# Patient Record
Sex: Male | Born: 1940
Health system: Southern US, Community
[De-identification: ages and names within clinical notes are randomized; demographics above are authoritative.]

## PROBLEM LIST (undated history)

## (undated) DIAGNOSIS — I519 Heart disease, unspecified: Secondary | ICD-10-CM

## (undated) DIAGNOSIS — E119 Type 2 diabetes mellitus without complications: Secondary | ICD-10-CM

## (undated) DIAGNOSIS — K635 Polyp of colon: Secondary | ICD-10-CM

## (undated) DIAGNOSIS — L821 Other seborrheic keratosis: Secondary | ICD-10-CM

## (undated) DIAGNOSIS — R011 Cardiac murmur, unspecified: Secondary | ICD-10-CM

## (undated) DIAGNOSIS — Z7952 Long term (current) use of systemic steroids: Secondary | ICD-10-CM

## (undated) DIAGNOSIS — F419 Anxiety disorder, unspecified: Secondary | ICD-10-CM

## (undated) DIAGNOSIS — I252 Old myocardial infarction: Secondary | ICD-10-CM

## (undated) DIAGNOSIS — Z951 Presence of aortocoronary bypass graft: Secondary | ICD-10-CM

## (undated) DIAGNOSIS — M353 Polymyalgia rheumatica: Secondary | ICD-10-CM

## (undated) DIAGNOSIS — N401 Enlarged prostate with lower urinary tract symptoms: Secondary | ICD-10-CM

## (undated) DIAGNOSIS — E785 Hyperlipidemia, unspecified: Secondary | ICD-10-CM

## (undated) DIAGNOSIS — H269 Unspecified cataract: Secondary | ICD-10-CM

## (undated) DIAGNOSIS — I639 Cerebral infarction, unspecified: Secondary | ICD-10-CM

## (undated) DIAGNOSIS — I25810 Atherosclerosis of coronary artery bypass graft(s) without angina pectoris: Secondary | ICD-10-CM

## (undated) DIAGNOSIS — M1990 Unspecified osteoarthritis, unspecified site: Secondary | ICD-10-CM

## (undated) DIAGNOSIS — I10 Essential (primary) hypertension: Secondary | ICD-10-CM

## (undated) HISTORY — DX: Cardiac murmur, unspecified: R01.1

## (undated) HISTORY — PX: EYE SURGERY: SHX253

## (undated) HISTORY — PX: WISDOM TOOTH EXTRACTION: SHX21

## (undated) HISTORY — PX: CORONARY ARTERY BYPASS GRAFT: SHX141

## (undated) HISTORY — DX: Unspecified cataract: H26.9

## (undated) HISTORY — DX: Essential (primary) hypertension: I10

## (undated) HISTORY — DX: Old myocardial infarction: I25.2

## (undated) HISTORY — DX: Unspecified osteoarthritis, unspecified site: M19.90

## (undated) HISTORY — DX: Hyperlipidemia, unspecified: E78.5

## (undated) HISTORY — DX: Type 2 diabetes mellitus without complications: E11.9

## (undated) HISTORY — DX: Cerebral infarction, unspecified: I63.9

## (undated) HISTORY — DX: Polyp of colon: K63.5

## (undated) HISTORY — PX: UPPER GASTROINTESTINAL ENDOSCOPY: SHX188

## (undated) HISTORY — DX: Heart disease, unspecified: I51.9

## (undated) HISTORY — DX: Anxiety disorder, unspecified: F41.9

---
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
## 1989-09-20 HISTORY — PX: OTHER SURGICAL HISTORY: SHX169

## 2013-08-20 HISTORY — PX: COLONOSCOPY: SHX174

## 2017-08-13 DIAGNOSIS — R319 Hematuria, unspecified: Secondary | ICD-10-CM | POA: Diagnosis not present

## 2017-08-13 DIAGNOSIS — E119 Type 2 diabetes mellitus without complications: Secondary | ICD-10-CM | POA: Diagnosis not present

## 2018-01-11 ENCOUNTER — Ambulatory Visit (INDEPENDENT_AMBULATORY_CARE_PROVIDER_SITE_OTHER): Payer: Medicare Other | Admitting: Family Medicine

## 2018-01-11 ENCOUNTER — Encounter: Payer: Self-pay | Admitting: Family Medicine

## 2018-01-11 ENCOUNTER — Encounter

## 2018-01-11 VITALS — BP 122/78 | HR 78 | Temp 98.0°F | Ht 67.0 in | Wt 194.1 lb

## 2018-01-11 DIAGNOSIS — M353 Polymyalgia rheumatica: Secondary | ICD-10-CM | POA: Diagnosis not present

## 2018-01-11 DIAGNOSIS — Z7952 Long term (current) use of systemic steroids: Secondary | ICD-10-CM

## 2018-01-11 DIAGNOSIS — E1165 Type 2 diabetes mellitus with hyperglycemia: Secondary | ICD-10-CM | POA: Insufficient documentation

## 2018-01-11 DIAGNOSIS — E119 Type 2 diabetes mellitus without complications: Secondary | ICD-10-CM

## 2018-01-11 HISTORY — DX: Type 2 diabetes mellitus without complications: E11.9

## 2018-01-11 HISTORY — DX: Long term (current) use of systemic steroids: Z79.52

## 2018-01-11 HISTORY — DX: Polymyalgia rheumatica: M35.3

## 2018-01-11 MED ORDER — PREDNISONE 10 MG PO TABS: ORAL_TABLET | ORAL | 0 refills | Status: DC

## 2018-01-11 MED ORDER — METFORMIN HCL 500 MG PO TABS: 1000.0000 mg | ORAL_TABLET | Freq: Two times a day (BID) | ORAL | 3 refills | Status: DC

## 2018-01-11 MED ORDER — ALPRAZOLAM 0.25 MG PO TABS: 0.2500 mg | ORAL_TABLET | Freq: Two times a day (BID) | ORAL | 5 refills | Status: DC | PRN

## 2018-01-11 MED ORDER — NITROGLYCERIN 0.4 MG SL SUBL: 0.4000 mg | SUBLINGUAL_TABLET | Freq: Every day | SUBLINGUAL | 0 refills | Status: DC | PRN

## 2018-01-11 MED ORDER — ATORVASTATIN CALCIUM 40 MG PO TABS: 40.0000 mg | ORAL_TABLET | Freq: Every day | ORAL | 3 refills | Status: DC

## 2018-01-11 MED ORDER — AMLODIPINE BESYLATE 5 MG PO TABS: 5.0000 mg | ORAL_TABLET | Freq: Every day | ORAL | 3 refills | Status: DC

## 2018-01-11 MED ORDER — GLYBURIDE 5 MG PO TABS: 5.0000 mg | ORAL_TABLET | Freq: Every day | ORAL | 3 refills | Status: DC

## 2018-01-11 MED ORDER — CARVEDILOL 3.125 MG PO TABS: 3.1250 mg | ORAL_TABLET | Freq: Two times a day (BID) | ORAL | 3 refills | Status: DC

## 2018-01-11 MED ORDER — LISINOPRIL-HYDROCHLOROTHIAZIDE 20-12.5 MG PO TABS: 1.0000 | ORAL_TABLET | Freq: Every day | ORAL | 3 refills | Status: DC

## 2018-01-11 NOTE — Patient Instructions (Addendum)
If you do not hear anything about your referrals and imaging in the next 1-2 weeks, call our office and ask for an update.  If the eye dr is too expensive, OK to get DM exam at [facility].  Let us know if you need anything.

## 2018-01-11 NOTE — Progress Notes (Signed)
Pre visit review using our clinic review tool, if applicable. No additional management support is needed unless otherwise documented below in the visit note. 

## 2018-01-11 NOTE — Progress Notes (Signed)
Chief Complaint  Patient presents with  . Establish Care       New Patient Visit SUBJECTIVE: HPI: Gary Flynn is an 77 y.o.male who is being seen for establishing care.   The patient was previously seen at office in [facility].  The patient has a history of polymyalgia rheumatica.  He is on 1-4 mg prednisone daily for this.  This is been the case for the past 2 years.  He has never had a bone density scan and has never seen a rheumatologist.  2 weeks ago, he started having a flare again.  No injury or change in activity.  He has a history of type 2 diabetes.  His last A1c was 7.0.  He is on prednisone at a near daily basis.  He has a history of a heart attack with triple bypass in 1991.  He used to be on Lipitor but was taken off, he cannot remember why.  He has not had an eye exam in over a year.  He is up-to-date with his flu shot and has had both pneumonia shots since turning 49. He is on ACEi.     Allergies  Allergen Reactions  . Ciprofloxacin   . Penicillins   . Tetanus Toxoids     Past Medical History:  Diagnosis Date  . Arthritis   . Colon polyps   . Diabetes mellitus without complication ([place])   . Heart disease   . Heart murmur   . Hyperlipidemia   . Hypertension   . Stroke [facility])    Past Surgical History:  Procedure Laterality Date  . HERNIA REPAIR  1960  . triple bypass  1991    Family History  Problem Relation Age of Onset  . Cancer Mother   . Heart disease Father   . Heart attack Father   . Cancer Sister   . Diabetes Sister   . Alcohol abuse Sister   . COPD Sister   . Early death Sister      Current Outpatient Medications:  .  ALPRAZolam (XANAX) 0.25 MG tablet, Take 1 tablet (0.25 mg total) by mouth 2 (two) times daily as needed for anxiety., Disp: 30 tablet, Rfl: 5 .  amLODipine (NORVASC) 5 MG tablet, Take 1 tablet (5 mg total) by mouth daily., Disp: 90 tablet, Rfl: 3 .  aspirin EC 81 MG tablet, Take 81 mg by mouth daily., Disp: , Rfl:  .   carvedilol (COREG) 3.125 MG tablet, Take 1 tablet (3.125 mg total) by mouth 2 (two) times daily with a meal., Disp: 180 tablet, Rfl: 3 .  glyBURIDE (DIABETA) 5 MG tablet, Take 1 tablet (5 mg total) by mouth daily with breakfast., Disp: 90 tablet, Rfl: 3 .  lisinopril-hydrochlorothiazide (PRINZIDE,ZESTORETIC) 20-12.5 MG tablet, Take 1 tablet by mouth daily., Disp: 90 tablet, Rfl: 3 .  metFORMIN (GLUCOPHAGE) 500 MG tablet, Take 2 tablets (1,000 mg total) by mouth 2 (two) times daily with a meal. Take 2 tablets twice daily, Disp: 360 tablet, Rfl: 3 .  nitroGLYCERIN (NITROSTAT) 0.4 MG SL tablet, Place 1 tablet (0.4 mg total) under the tongue daily as needed for chest pain., Disp: 30 tablet, Rfl: 0 .  predniSONE (DELTASONE) 1 MG tablet, Take 1 to 4 tablets daily as needed, Disp: , Rfl:  .  atorvastatin (LIPITOR) 40 MG tablet, Take 1 tablet (40 mg total) by mouth daily., Disp: 90 tablet, Rfl: 3 .  predniSONE (DELTASONE) 10 MG tablet, Take 2 tabs daily for 7 days and then  1 tab daily for 7 days., Disp: 21 tablet, Rfl: 0  ROS  Cardiovascular: Denies chest pain  Respiratory: Denies dyspnea   OBJECTIVE: BP 122/78 (BP Location: Left Arm, Patient Position: Sitting, Cuff Size: Normal)   Pulse 78   Temp 98 F (36.7 C) (Oral)   Ht 5\' 7"  (1.702 m)   Wt 194 lb 2 oz (88.1 kg)   SpO2 96%   BMI 30.40 kg/m   Constitutional: -  VS reviewed -  Well developed, well nourished, appears stated age -  No apparent distress  Psychiatric: -  Oriented to person, place, and time -  Memory intact -  Affect and mood normal -  Fluent conversation, good eye contact -  Judgment and insight age appropriate  Eye: -  Conjunctivae clear, no discharge -  Pupils symmetric, round, reactive to light  ENMT: -  MMM    Pharynx moist, no exudate, no erythema  Neck: -  No gross swelling, no palpable masses -  Thyroid midline, not enlarged, mobile, no palpable masses  Cardiovascular: -  RRR -  No LE edema  Respiratory: -   Normal respiratory effort, no accessory muscle use, no retraction -  Breath sounds equal, no wheezes, no ronchi, no crackles  Musculoskeletal: -  No clubbing, no cyanosis -  Gait normal  Skin: -  No significant lesion on inspection -  Warm and dry to palpation   ASSESSMENT/PLAN: Polymyalgia rheumatica ([place]) - Plan: Ambulatory referral to Rheumatology  Controlled type 2 diabetes mellitus without complication, without long-term current use of insulin ([place]) - Plan: Ambulatory referral to Ophthalmology  Long term (current) use of systemic steroids - Plan: DG Bone Density  Pt will get [place] hard copy of records. Will give a short taper of high-dose prednisone to do with his flare.  I would like him to see rheumatology for this issue. Continue current regimen for diabetes.  Recommended starting a statin given his history of coronary artery disease, MI, and diabetes.  Will refer to ophthalmology as he is due.  Will await records to make sure he is up-to-date with his immunizations. Given his long-term use of steroids, we will set up a bone density scan. Patient should return  In 3 mo for dm visit. The patient voiced understanding and agreement to the plan.   [place], DO 01/11/18  12:20 PM

## 2018-01-12 ENCOUNTER — Other Ambulatory Visit: Payer: Self-pay | Admitting: Emergency Medicine

## 2018-01-12 ENCOUNTER — Telehealth: Payer: Self-pay | Admitting: Family Medicine

## 2018-01-12 NOTE — Telephone Encounter (Signed)
Some records reviewed. I did not see a tetanus booster in hx. Last A1c 7.0. I think this is an OK level given his age. Other labs unremarkable, nothing immediate needs to be done.

## 2018-01-16 ENCOUNTER — Ambulatory Visit (HOSPITAL_BASED_OUTPATIENT_CLINIC_OR_DEPARTMENT_OTHER)
Admission: RE | Admit: 2018-01-16 | Discharge: 2018-01-16 | Disposition: A | Discharge status: Home / Self Care | Payer: Medicare Other | Source: Ambulatory Visit | Attending: Family Medicine | Admitting: Family Medicine

## 2018-01-16 DIAGNOSIS — M85851 Other specified disorders of bone density and structure, right thigh: Secondary | ICD-10-CM | POA: Insufficient documentation

## 2018-01-16 DIAGNOSIS — Z7952 Long term (current) use of systemic steroids: Secondary | ICD-10-CM | POA: Diagnosis not present

## 2018-01-18 DIAGNOSIS — H353131 Nonexudative age-related macular degeneration, bilateral, early dry stage: Secondary | ICD-10-CM | POA: Diagnosis not present

## 2018-01-18 DIAGNOSIS — H35372 Puckering of macula, left eye: Secondary | ICD-10-CM | POA: Diagnosis not present

## 2018-01-18 DIAGNOSIS — Z961 Presence of intraocular lens: Secondary | ICD-10-CM | POA: Diagnosis not present

## 2018-01-18 DIAGNOSIS — H26493 Other secondary cataract, bilateral: Secondary | ICD-10-CM | POA: Diagnosis not present

## 2018-01-20 ENCOUNTER — Encounter: Payer: Self-pay | Admitting: Family Medicine

## 2018-01-23 ENCOUNTER — Other Ambulatory Visit: Payer: Self-pay | Admitting: Family Medicine

## 2018-01-23 DIAGNOSIS — Z1211 Encounter for screening for malignant neoplasm of colon: Secondary | ICD-10-CM

## 2018-01-25 ENCOUNTER — Other Ambulatory Visit: Payer: Self-pay | Admitting: Family Medicine

## 2018-01-25 ENCOUNTER — Telehealth: Payer: Self-pay | Admitting: Internal Medicine

## 2018-01-25 NOTE — Telephone Encounter (Signed)
Pt requesting refill on prednisone?

## 2018-01-26 MED ORDER — PREDNISONE 10 MG PO TABS: 5.0000 mg | ORAL_TABLET | Freq: Every day | ORAL | 2 refills | Status: DC

## 2018-01-26 NOTE — Telephone Encounter (Signed)
Pt w suspected PMR, in process of setting up with rheum. Will call in supply until he can get in.

## 2018-01-27 ENCOUNTER — Encounter: Payer: Self-pay | Admitting: Internal Medicine

## 2018-01-27 NOTE — Telephone Encounter (Signed)
Dr.Perry reviewed records and accepted for patient to be scheduled for a direct colonoscopy in [place]. Patient has been notified of this and ha been scheduled.

## 2018-01-27 NOTE — Telephone Encounter (Signed)
Relevant records reviewed. Last colonoscopy 09/05/2013 in [place]. 5 mm polyp removed with cold biopsy forceps. Pathology reveals tubular adenoma. Would be due for routine follow-up December 2019.

## 2018-01-30 ENCOUNTER — Encounter: Payer: Self-pay | Admitting: Family Medicine

## 2018-01-31 ENCOUNTER — Ambulatory Visit (INDEPENDENT_AMBULATORY_CARE_PROVIDER_SITE_OTHER): Payer: Medicare Other | Admitting: Family Medicine

## 2018-01-31 ENCOUNTER — Encounter: Payer: Self-pay | Admitting: Family Medicine

## 2018-01-31 ENCOUNTER — Ambulatory Visit: Payer: Medicare Other | Admitting: Family Medicine

## 2018-01-31 VITALS — BP 112/70 | HR 88 | Temp 98.0°F | Ht 67.0 in | Wt 192.2 lb

## 2018-01-31 DIAGNOSIS — J209 Acute bronchitis, unspecified: Secondary | ICD-10-CM | POA: Diagnosis not present

## 2018-01-31 MED ORDER — PREDNISONE 20 MG PO TABS: 40.0000 mg | ORAL_TABLET | Freq: Every day | ORAL | 0 refills | Status: AC

## 2018-01-31 MED ORDER — AZITHROMYCIN 250 MG PO TABS: ORAL_TABLET | ORAL | 0 refills | Status: DC

## 2018-01-31 MED ORDER — HYDROCODONE-HOMATROPINE 5-1.5 MG/5ML PO SYRP: 5.0000 mL | ORAL_SOLUTION | Freq: Four times a day (QID) | ORAL | 0 refills | Status: DC | PRN

## 2018-01-31 NOTE — Progress Notes (Signed)
Pre visit review using our clinic review tool, if applicable. No additional management support is needed unless otherwise documented below in the visit note. 

## 2018-01-31 NOTE — Progress Notes (Signed)
Chief Complaint  Patient presents with  . Cough    for one month    Gary Flynn here for URI complaints.  Duration: 3 weeks  Associated symptoms: rhinorrhea, wheezing and cough Denies: sinus congestion, sinus pain, itchy watery eyes, ear pain, ear drainage, sore throat, shortness of breath, myalgia and fevers Treatment to date: Hycodan Sick contacts: No  ROS:  Const: Denies fevers HEENT: As noted in HPI Lungs: No SOB  Past Medical History:  Diagnosis Date  . Arthritis   . Colon polyps   . Diabetes mellitus without complication ([place])   . Heart disease   . Heart murmur   . Hyperlipidemia   . Hypertension   . Stroke ([place])     BP 112/70 (BP Location: Left Arm, Patient Position: Sitting, Cuff Size: Normal)   Pulse 88   Temp 98 F (36.7 C) (Oral)   Ht 5\' 7"  (1.702 m)   Wt 192 lb 4 oz (87.2 kg)   SpO2 93%   BMI 30.11 kg/m  General: Awake, alert, appears stated age HEENT: AT, [place], ears patent b/l and TM's neg, nares patent w/o discharge, pharynx pink and without exudates, MMM Neck: No masses or asymmetry Heart: RRR Lungs: Diff exp wheezing, no accessory muscle use Psych: Age appropriate judgment and insight, normal mood and affect  Acute wheezy bronchitis - Plan: azithromycin (ZITHROMAX) 250 MG tablet, predniSONE (DELTASONE) 20 MG tablet, HYDROcodone-homatropine (HYCODAN) 5-1.5 MG/5ML syrup  Orders as above. Syrup- warning for this given. Prednisone- replace reg pred with a burst. Abx- only if not getting better over next 2-3 days. Continue to push fluids, practice good hand hygiene, cover mouth when coughing. F/u prn. If starting to experience fevers, shaking, or shortness of breath, seek immediate care. Pt voiced understanding and agreement to the plan.  [place], DO 01/31/18 2:58 PM

## 2018-01-31 NOTE — Patient Instructions (Addendum)
Do not drink alcohol, do any illicit/street drugs, drive or do anything that requires alertness while on this cough syrup.   Continue to push fluids, practice good hand hygiene, and cover your mouth if you cough.  If you start having fevers, shaking or shortness of breath, seek immediate care.  Don't take your usual supply of prednisone while on the higher dose that I am calling in.   Don't take antibiotic (azithromycin) for next 2-3 days and only if you are not getting better.  Let us know if you need anything.

## 2018-02-17 DIAGNOSIS — I252 Old myocardial infarction: Secondary | ICD-10-CM | POA: Insufficient documentation

## 2018-02-17 DIAGNOSIS — M353 Polymyalgia rheumatica: Secondary | ICD-10-CM | POA: Diagnosis not present

## 2018-02-17 DIAGNOSIS — M8580 Other specified disorders of bone density and structure, unspecified site: Secondary | ICD-10-CM | POA: Diagnosis not present

## 2018-02-17 DIAGNOSIS — I25810 Atherosclerosis of coronary artery bypass graft(s) without angina pectoris: Secondary | ICD-10-CM | POA: Insufficient documentation

## 2018-02-17 DIAGNOSIS — Z951 Presence of aortocoronary bypass graft: Secondary | ICD-10-CM | POA: Insufficient documentation

## 2018-02-17 HISTORY — DX: Atherosclerosis of coronary artery bypass graft(s) without angina pectoris: I25.810

## 2018-02-17 HISTORY — DX: Presence of aortocoronary bypass graft: Z95.1

## 2018-03-06 ENCOUNTER — Encounter: Payer: Self-pay | Admitting: Family Medicine

## 2018-03-08 ENCOUNTER — Ambulatory Visit (INDEPENDENT_AMBULATORY_CARE_PROVIDER_SITE_OTHER): Payer: Medicare Other | Admitting: Family Medicine

## 2018-03-08 ENCOUNTER — Encounter: Payer: Self-pay | Admitting: Family Medicine

## 2018-03-08 VITALS — BP 112/72 | HR 72 | Temp 97.8°F | Ht 67.0 in | Wt 194.1 lb

## 2018-03-08 DIAGNOSIS — R350 Frequency of micturition: Secondary | ICD-10-CM

## 2018-03-08 DIAGNOSIS — N401 Enlarged prostate with lower urinary tract symptoms: Secondary | ICD-10-CM | POA: Diagnosis not present

## 2018-03-08 DIAGNOSIS — L821 Other seborrheic keratosis: Secondary | ICD-10-CM | POA: Diagnosis not present

## 2018-03-08 DIAGNOSIS — M85649 Other cyst of bone, unspecified hand: Secondary | ICD-10-CM | POA: Diagnosis not present

## 2018-03-08 HISTORY — DX: Other seborrheic keratosis: L82.1

## 2018-03-08 HISTORY — DX: Benign prostatic hyperplasia with lower urinary tract symptoms: N40.1

## 2018-03-08 NOTE — Patient Instructions (Signed)
The lesions on your head are normal. If they start bothering you, let me know and we can remove them.  If you decide you want to try a medicine for your urinary issue, let me know.  Let us know if you need anything.

## 2018-03-08 NOTE — Progress Notes (Signed)
Pre visit review using our clinic review tool, if applicable. No additional management support is needed unless otherwise documented below in the visit note. 

## 2018-03-08 NOTE — Progress Notes (Signed)
Chief Complaint  Patient presents with  . Urinary Frequency  . scalp and hands growths/lumps    Gary Flynn is a 77 y.o. male here for a skin complaint.  Duration: 3 months Location: scalp Pruritic? No Painful? No Drainage? No New soaps/lotions/topicals/detergents? No Seems to be growing. No sig hx of sunburns on scalp.    He also states that his wife told him that he urinates more frequently as he is been getting older.  He did receive routine prostate cancer screening while in [place] before moving down here.  There were never any issues.  He is not having any blood in his urine, pain with urination, fevers, abdominal pain, or constipation.  ROS:  Const: No fevers Skin: As noted in HPI  Past Medical History:  Diagnosis Date  . Arthritis   . Colon polyps   . Diabetes mellitus without complication ([place])   . Heart disease   . Heart murmur   . Hyperlipidemia   . Hypertension   . Stroke [facility])    Allergies  Allergen Reactions  . Ciprofloxacin   . Penicillins   . Tetanus Toxoids    Allergies as of 03/08/2018      Reactions   Ciprofloxacin    Penicillins    Tetanus Toxoids       Medication List        Accurate as of 03/08/18  1:00 PM. Always use your most recent med list.          alendronate 70 MG tablet Commonly known as:  FOSAMAX Take 70 mg by mouth once a week. Take with a full glass of water on an empty stomach.   ALPRAZolam 0.25 MG tablet Commonly known as:  XANAX Take 1 tablet (0.25 mg total) by mouth 2 (two) times daily as needed for anxiety.   amLODipine 5 MG tablet Commonly known as:  NORVASC Take 1 tablet (5 mg total) by mouth daily.   aspirin EC 81 MG tablet Take 81 mg by mouth daily.   atorvastatin 40 MG tablet Commonly known as:  LIPITOR Take 1 tablet (40 mg total) by mouth daily.   carvedilol 3.125 MG tablet Commonly known as:  COREG Take 1 tablet (3.125 mg total) by mouth 2 (two) times daily with a meal.   glyBURIDE 5 MG  tablet Commonly known as:  DIABETA Take 1 tablet (5 mg total) by mouth daily with breakfast.   lisinopril-hydrochlorothiazide 20-12.5 MG tablet Commonly known as:  PRINZIDE,ZESTORETIC Take 1 tablet by mouth daily.   metFORMIN 500 MG tablet Commonly known as:  GLUCOPHAGE Take 2 tablets (1,000 mg total) by mouth 2 (two) times daily with a meal. Take 2 tablets twice daily   nitroGLYCERIN 0.4 MG SL tablet Commonly known as:  NITROSTAT Place 1 tablet (0.4 mg total) under the tongue daily as needed for chest pain.   predniSONE 1 MG tablet Commonly known as:  DELTASONE Take 1 to 4 tablets daily as needed   predniSONE 10 MG tablet Commonly known as:  DELTASONE Take 0.5-1 tablets (5-10 mg total) by mouth daily with breakfast.       BP 112/72 (BP Location: Left Arm, Patient Position: Sitting, Cuff Size: Large)   Pulse 72   Temp 97.8 F (36.6 C) (Oral)   Ht 5\' 7"  (1.702 m)   Wt 194 lb 2 oz (88.1 kg)   SpO2 94%   BMI 30.40 kg/m  Gen: awake, alert, appearing stated age Lungs: No accessory muscle use  Skin: See  below. No drainage, erythema, TTP, fluctuance; small cyst on dorsal L hand GU: Prostate is smooth, quite enlarged, non tender to palpation Heart: RRR Lungs: CTAB, no access msc use Psych: Age appropriate judgment and insight        Benign prostatic hyperplasia with urinary frequency  Seborrheic keratoses  Bone cyst of hand  Offered to tx above. He declined at this time. Watchful waiting for #2, will remove vs refer to derm if bothersome or change. Watchful waiting for #3, will refer to hand if bothersome. F/u prn. The patient voiced understanding and agreement to the plan.  [place], DO 03/08/18 1:00 PM

## 2018-03-09 ENCOUNTER — Telehealth: Payer: Self-pay | Admitting: *Deleted

## 2018-03-09 NOTE — Telephone Encounter (Signed)
Dr Henrene Pastor,  This gentle man is scheduled for a colon with you 04-10-18 Monday.  He is a new pt to [place] and per his records his last colon was 09-05-2013. He had TA polyps at this colon. He has a hx polyps prior to this as well.   Your note on the old records states colon Nov/Dec 2019.  I just wanted to check with you to see if July is ok or if I need to RS him to Nov or Dec.  Please advise, thanks for your time, Marijean Niemann

## 2018-03-09 NOTE — Telephone Encounter (Signed)
Per TE 01-25-2018 Dr Henrene Pastor stated pt needs colon DEC 2019-- its also documented on the old records--  I called Gary Flynn and explained this to him - his last colon was 09-05-2013 and with a 5 yr recall that would be Dec 2019- /he is agreeable to cancel the July colon and call back to Rs for DEC- I instructed him to call early October and I also placed a recall for nov/dec 2019 as well.  Recall number [id number].  Old records will be sent back to 3rd floor to be scanned into pt's chart.  Lelan Pons PV

## 2018-04-10 ENCOUNTER — Encounter: Payer: Medicare Other | Admitting: Internal Medicine

## 2018-04-12 ENCOUNTER — Ambulatory Visit: Payer: Medicare Other | Admitting: Family Medicine

## 2018-06-15 ENCOUNTER — Ambulatory Visit (INDEPENDENT_AMBULATORY_CARE_PROVIDER_SITE_OTHER): Payer: Medicare Other | Admitting: Family Medicine

## 2018-06-15 ENCOUNTER — Encounter: Payer: Self-pay | Admitting: Family Medicine

## 2018-06-15 VITALS — BP 115/61 | HR 68 | Temp 97.7°F | Ht 67.0 in | Wt 194.0 lb

## 2018-06-15 DIAGNOSIS — E119 Type 2 diabetes mellitus without complications: Secondary | ICD-10-CM

## 2018-06-15 DIAGNOSIS — Z23 Encounter for immunization: Secondary | ICD-10-CM

## 2018-06-15 DIAGNOSIS — L989 Disorder of the skin and subcutaneous tissue, unspecified: Secondary | ICD-10-CM | POA: Diagnosis not present

## 2018-06-15 DIAGNOSIS — Z2883 Immunization not carried out due to unavailability of vaccine: Secondary | ICD-10-CM

## 2018-06-15 LAB — CBC
HEMATOCRIT: 39 % (ref 39.0–52.0)
Hemoglobin: 13.4 g/dL (ref 13.0–17.0)
MCHC: 34.3 g/dL (ref 30.0–36.0)
MCV: 90 fl (ref 78.0–100.0)
PLATELETS: 267 10*3/uL (ref 150.0–400.0)
RBC: 4.33 Mil/uL (ref 4.22–5.81)
RDW: 13.2 % (ref 11.5–15.5)
WBC: 8.4 10*3/uL (ref 4.0–10.5)

## 2018-06-15 LAB — HEMOGLOBIN A1C: Hgb A1c MFr Bld: 6.9 % — ABNORMAL HIGH (ref 4.6–6.5)

## 2018-06-15 LAB — COMPREHENSIVE METABOLIC PANEL
ALBUMIN: 4.3 g/dL (ref 3.5–5.2)
ALT: 13 U/L (ref 0–53)
AST: 13 U/L (ref 0–37)
Alkaline Phosphatase: 50 U/L (ref 39–117)
BUN: 26 mg/dL — AB (ref 6–23)
CALCIUM: 9.3 mg/dL (ref 8.4–10.5)
CO2: 30 mEq/L (ref 19–32)
Chloride: 100 mEq/L (ref 96–112)
Creatinine, Ser: 1.52 mg/dL — ABNORMAL HIGH (ref 0.40–1.50)
GFR: 47.57 mL/min — ABNORMAL LOW (ref >60.00)
Glucose, Bld: 140 mg/dL — ABNORMAL HIGH (ref 70–99)
POTASSIUM: 4.3 meq/L (ref 3.5–5.1)
SODIUM: 138 meq/L (ref 135–145)
Total Bilirubin: 0.6 mg/dL (ref 0.2–1.2)
Total Protein: 6.7 g/dL (ref 6.0–8.3)

## 2018-06-15 LAB — LIPID PANEL
Cholesterol: 119 mg/dL (ref 0–200)
HDL: 41.6 mg/dL (ref >39.00)
LDL Cholesterol: 60 mg/dL (ref 0–99)
NONHDL: 77.49
Total CHOL/HDL Ratio: 3
Triglycerides: 85 mg/dL (ref 0.0–149.0)
VLDL: 17 mg/dL (ref 0.0–40.0)

## 2018-06-15 MED ORDER — ALENDRONATE SODIUM 70 MG PO TABS: 70.0000 mg | ORAL_TABLET | ORAL | 1 refills | Status: DC

## 2018-06-15 NOTE — Progress Notes (Signed)
Subjective:   Chief Complaint  Patient presents with  . Follow-up    Pt here for 3 month f/u visit.     Gary Flynn is a 77 y.o. male here for follow-up of diabetes.   Gary Flynn  Does not check his sugars. Patient does not require insulin.   Medications include: Metformin, glyburide qd Reports diet is healthy overall. Patient exercises 1-2 days per week on average.    Pt has 3 lesions, 2 on scalp and 1 on back that he would like looked at. They are irritating and he has a hx of skin cancer and would like them removed. No drainage.  Past Medical History:  Diagnosis Date  . Arthritis   . Colon polyps   . Diabetes mellitus without complication ([place])   . Heart disease   . Heart murmur   . Hyperlipidemia   . Hypertension   . Stroke [facility])     Related testing: Date of retinal exam: 01/18/18  Done by:  Dr. Bing Plume Pneumovax: done Flu Shot: done today  Review of Systems: Pulmonary:  No SOB Cardiovascular:  No chest pain  Objective:  BP 115/61 (BP Location: Left Arm, Patient Position: Sitting, Cuff Size: Normal)   Pulse 68   Temp 97.7 F (36.5 C) (Oral)   Ht 5\' 7"  (1.702 m)   Wt 194 lb (88 kg)   SpO2 97%   BMI 30.38 kg/m  General:  Well developed, well nourished, in no apparent distress Skin:  Warm, no pallor or diaphoresis; there are 2 raised and scaly lesions on the ant-left side of scalp in addition to a raised and scaly lesion over the middle back. No erythema, fluctuance, drainage. Head:  Normocephalic, atraumatic Eyes:  Pupils equal and round, sclera anicteric without injection  Lungs:  CTAB, no access msc use Cardio:  RRR, no bruits, no LE edema Musculoskeletal:  Symmetrical muscle groups noted without atrophy or deformity Neuro:  Sensation intact to pinprick on feet Psych: Age appropriate judgment and insight  Assessment:   Controlled type 2 diabetes mellitus without complication, without long-term current use of insulin ([place]) - Plan: HM Diabetes Foot Exam, CBC,  Comprehensive metabolic panel, Hemoglobin A1c, Lipid panel  Skin lesions  Influenza immunization not administered due to inavailability of vaccine  Immunization due - Plan: Flu vaccine HIGH DOSE PF (Fluzone High dose)   Plan:   Orders as above. Cont Metformin for now, check above. Flu shot today.  Counseled on diet and exercise. Pt reports allergy to tetanus vaccine, will hold off for now.  F/u in 6 mo for DM, at earliest convenience for skin procedure with me. The patient voiced understanding and agreement to the plan.  [place], DO 06/15/18 10:35 AM

## 2018-06-15 NOTE — Patient Instructions (Signed)
Stay active and keep diet clean.  Give [place] 2-3 business days to get the results of your labs back.   Let us know if you need anything.

## 2018-06-19 ENCOUNTER — Other Ambulatory Visit: Payer: Self-pay | Admitting: Family Medicine

## 2018-06-19 DIAGNOSIS — N179 Acute kidney failure, unspecified: Secondary | ICD-10-CM

## 2018-06-21 ENCOUNTER — Other Ambulatory Visit (INDEPENDENT_AMBULATORY_CARE_PROVIDER_SITE_OTHER): Payer: Medicare Other

## 2018-06-21 DIAGNOSIS — N179 Acute kidney failure, unspecified: Secondary | ICD-10-CM

## 2018-06-21 LAB — BASIC METABOLIC PANEL
BUN: 22 mg/dL (ref 6–23)
CALCIUM: 9.1 mg/dL (ref 8.4–10.5)
CHLORIDE: 102 meq/L (ref 96–112)
CO2: 28 meq/L (ref 19–32)
Creatinine, Ser: 1.26 mg/dL (ref 0.40–1.50)
GFR: 59.06 mL/min — ABNORMAL LOW (ref >60.00)
Glucose, Bld: 157 mg/dL — ABNORMAL HIGH (ref 70–99)
Potassium: 4.3 mEq/L (ref 3.5–5.1)
SODIUM: 138 meq/L (ref 135–145)

## 2018-06-23 ENCOUNTER — Encounter: Payer: Self-pay | Admitting: Family Medicine

## 2018-06-23 ENCOUNTER — Ambulatory Visit (INDEPENDENT_AMBULATORY_CARE_PROVIDER_SITE_OTHER): Payer: Medicare Other | Admitting: Family Medicine

## 2018-06-23 DIAGNOSIS — L820 Inflamed seborrheic keratosis: Secondary | ICD-10-CM | POA: Diagnosis not present

## 2018-06-23 DIAGNOSIS — L570 Actinic keratosis: Secondary | ICD-10-CM | POA: Diagnosis not present

## 2018-06-23 DIAGNOSIS — L989 Disorder of the skin and subcutaneous tissue, unspecified: Secondary | ICD-10-CM

## 2018-06-23 NOTE — Patient Instructions (Signed)
Do not shower for the rest of the day. When you do wash it, use only soap and water. Do not vigorously scrub. Apply triple antibiotic ointment (like Neosporin) twice daily. Keep the area clean and dry.   Things to look out for: increasing pain not relieved by ibuprofen/acetaminophen, fevers, spreading redness, drainage of pus, or foul odor.  Give [place] 1 business week to get the results of your biopsies back.  Let us know if you need anything.

## 2018-06-23 NOTE — Addendum Note (Signed)
Addended by: Sharon Seller B on: 06/23/2018 12:45 PM   Modules accepted: Orders

## 2018-06-23 NOTE — Progress Notes (Signed)
CC: Skin procedure  The patient had 3 lesions on his skin that he wanted removed as he was concerned they may be related to cancer.  Skin: On the scalp on the left anterior portion, there is a 0.3 x 0.5 cm raised lesion that is mildly hyperpigmented and scaled.  Lateral and anterior to this there is a larger lesion measuring 1.1 x 0.7 cm in diameter.  It is also raised with mild increase in pigmentation.  On the back near T10 on the left, there is a raised scaly lesion with varying colors.  It measures approximately 1.1 x 0.5 cm in diameter.  Procedure note; shave biopsy Informed consent was obtained. The area was cleaned with alcohol and injected with 1 mL of 1% lidocaine with epinephrine. A Dermablade was slightly bent and used to cut under the area of interest. The specimen was placed in a sterile specimen cup and sent to the lab. The area was then cauterized ensuring adequate hemostasis. The area was dressed with triple antibiotic ointment and a bandage. The area on his scalp had same procedure with 0.4 cc of above anesthetic and minus placing of bandaid. The other area on scalp had same procedure with 1 cc of above anesthetic and minus bandaid placement. There were no complications noted. The patient tolerated the procedure well.  Skin lesions - Plan: PR SHAV SKIN LES 1.1-2.0 CM TRUNK,ARM,LEG, PR SHAV SKIN LES <0.5 CM FACE,FACIAL, PR SHAV SKIN LES 1.1-2.0 CM FACE,FACIAL  Aftercare instructions verbalized and written down.  Give [place] 1 business week to get the results of the biopsy back. Warning signs and symptoms verbalized and written down in AVS.  F/u prn. The patient voiced understanding and agreement to the plan.  [place] Oyster Jashan Cotten 12:09 PM 06/23/18

## 2018-06-27 ENCOUNTER — Encounter: Payer: Self-pay | Admitting: Family Medicine

## 2018-06-28 ENCOUNTER — Telehealth: Payer: Self-pay | Admitting: *Deleted

## 2018-06-28 NOTE — Telephone Encounter (Signed)
Received Dermatopathology Report results from [facility]; forwarded to provider/SLS 10/09

## 2018-07-18 ENCOUNTER — Encounter: Payer: Self-pay | Admitting: Family Medicine

## 2018-07-27 ENCOUNTER — Encounter: Payer: Self-pay | Admitting: Family Medicine

## 2018-07-27 ENCOUNTER — Ambulatory Visit (INDEPENDENT_AMBULATORY_CARE_PROVIDER_SITE_OTHER): Payer: Medicare Other | Admitting: Family Medicine

## 2018-07-27 VITALS — BP 120/80 | HR 74 | Temp 97.8°F | Ht 67.0 in | Wt 199.2 lb

## 2018-07-27 DIAGNOSIS — M353 Polymyalgia rheumatica: Secondary | ICD-10-CM

## 2018-07-27 MED ORDER — PREDNISONE 5 MG PO TABS: 5.0000 mg | ORAL_TABLET | Freq: Every day | ORAL | 1 refills | Status: DC

## 2018-07-27 MED ORDER — GABAPENTIN 100 MG PO CAPS: 100.0000 mg | ORAL_CAPSULE | Freq: Three times a day (TID) | ORAL | 1 refills | Status: DC

## 2018-07-27 NOTE — Patient Instructions (Signed)
The biggest side effect of this new medicine is drowsiness. Take it at night first to see if it makes you drowsy. If it does, do not take during the day and let me know.   We are going back to 5 mg daily of prednisone.   Ice/cold pack over area for 10-15 min twice daily.  Let us know if you need anything.

## 2018-07-27 NOTE — Progress Notes (Signed)
Chief Complaint  Patient presents with  . Medication Problem    predinisone    Subjective: Patient is a 77 y.o. male here for med discussion.  Patient with history of polymyalgia rheumatica.  He was referred to the rheumatology team who started him on Fosamax.  He has gone from 20 mg of prednisone daily to currently 1 mg daily.  He is having significant pains and achiness in both of his thighs radiating down the rest of his legs.  This started to bother him when he was weaning down to around 6 mg daily.  His wife has chronic pain issues and was started on Neurontin.  He is interested in also starting this.  ROS: MSK: +joint/muscle pains  Past Medical History:  Diagnosis Date  . Arthritis   . Colon polyps   . Diabetes mellitus without complication ([place])   . Heart disease   . Heart murmur   . Hyperlipidemia   . Hypertension   . Stroke ([place])     Objective: BP 120/80 (BP Location: Left Arm, Patient Position: Sitting, Cuff Size: Normal)   Pulse 74   Temp 97.8 F (36.6 C) (Oral)   Ht 5\' 7"  (1.702 m)   Wt 199 lb 4 oz (90.4 kg)   SpO2 94%   BMI 31.21 kg/m  General: Awake, appears stated age Lungs: No accessory muscle use Psych: Age appropriate judgment and insight, normal affect and mood  Assessment and Plan: Polymyalgia rheumatica ([place]) - Plan: predniSONE (DELTASONE) 5 MG tablet, gabapentin (NEURONTIN) 100 MG capsule  Orders as above.  Start Neurontin 100 mg 3 times daily.  Do not take during the day if it makes him drowsy at night.  We will increase his prednisone back to 5 mg daily.  The main goal is to give him quality of life.  I would like to know if there are any DMARDs for him or if we are to continue prednisone. I will see him back in 1 month. The patient voiced understanding and agreement to the plan.  [place], DO 07/27/18  12:03 PM

## 2018-07-27 NOTE — Progress Notes (Signed)
Pre visit review using our clinic review tool, if applicable. No additional management support is needed unless otherwise documented below in the visit note. 

## 2018-08-30 NOTE — Progress Notes (Signed)
Subjective:   Gary Flynn is a 77 y.o. male who presents for an Initial Medicare Annual Wellness Visit.  The Patient was informed that the wellness visit is to identify future health risk and educate and initiate measures that can reduce risk for increased disease through the lifespan.   Describes health as fair, good or great? very well  Pt retired from Engineer, production.  Review of Systems No ROS.  Medicare Wellness Visit. Additional risk factors are reflected in the social history. Cardiac Risk Factors include: advanced age (>34men, >61 women);dyslipidemia Sleep patterns: no issues Home Safety/Smoke Alarms: Feels safe in home. Smoke alarms in place. Lives with wife and dog in 1 story home. Currently having walk-in shower with bench installed.    Male:   [facility]- pt states he will schedule at first of yr.     PSA- No results found for: PSA     Objective:    Today's Vitals   08/31/18 1020  BP: 140/78  Pulse: 71  SpO2: 95%  Weight: 201 lb (91.2 kg)  Height: 5\' 7"  (1.702 m)   Body mass index is 31.48 kg/m.  Advanced Directives 08/31/2018  Does Patient Have a Medical Advance Directive? No  Would patient like information on creating a medical advance directive? Yes (MAU/Ambulatory/Procedural Areas - Information given)    Current Medications (verified) Outpatient Encounter Medications as of 08/31/2018  Medication Sig  . alendronate (FOSAMAX) 70 MG tablet Take 1 tablet (70 mg total) by mouth once a week. Take with a full glass of water on an empty stomach.  . ALPRAZolam (XANAX) 0.25 MG tablet Take 1 tablet (0.25 mg total) by mouth 2 (two) times daily as needed for anxiety.  Marland Kitchen amLODipine (NORVASC) 5 MG tablet Take 1 tablet (5 mg total) by mouth daily.  Marland Kitchen aspirin EC 81 MG tablet Take 81 mg by mouth daily.  Marland Kitchen atorvastatin (LIPITOR) 40 MG tablet Take 1 tablet (40 mg total) by mouth daily.  . carvedilol (COREG) 3.125 MG tablet Take 1 tablet (3.125 mg total) by mouth 2 (two) times  daily with a meal.  . gabapentin (NEURONTIN) 100 MG capsule Take 1 capsule (100 mg total) by mouth 3 (three) times daily.  Marland Kitchen glyBURIDE (DIABETA) 5 MG tablet Take 1 tablet (5 mg total) by mouth daily with breakfast.  . lisinopril-hydrochlorothiazide (PRINZIDE,ZESTORETIC) 20-12.5 MG tablet Take 1 tablet by mouth daily.  . metFORMIN (GLUCOPHAGE) 500 MG tablet Take 2 tablets (1,000 mg total) by mouth 2 (two) times daily with a meal. Take 2 tablets twice daily  . nitroGLYCERIN (NITROSTAT) 0.4 MG SL tablet Place 1 tablet (0.4 mg total) under the tongue daily as needed for chest pain.  . predniSONE (DELTASONE) 5 MG tablet Take 1 tablet (5 mg total) by mouth daily with breakfast.   No facility-administered encounter medications on file as of 08/31/2018.     Allergies (verified) Ciprofloxacin; Penicillins; and Tetanus toxoids   History: Past Medical History:  Diagnosis Date  . Arthritis   . Colon polyps   . Diabetes mellitus without complication ([place])   . Heart disease   . Heart murmur   . Hyperlipidemia   . Hypertension   . Stroke [facility])    Past Surgical History:  Procedure Laterality Date  . HERNIA REPAIR  1960  . triple bypass  1991   Family History  Problem Relation Age of Onset  . Cancer Mother   . Heart disease Father   . Heart attack Father   . Cancer Sister   .  Diabetes Sister   . Alcohol abuse Sister   . COPD Sister   . Early death Sister    Social History   Socioeconomic History  . Marital status: Married    Spouse name: Not on file  . Number of children: Not on file  . Years of education: Not on file  . Highest education level: Not on file  Occupational History  . Not on file  Social Needs  . Financial resource strain: Not on file  . Food insecurity:    Worry: Not on file    Inability: Not on file  . Transportation needs:    Medical: Not on file    Non-medical: Not on file  Tobacco Use  . Smoking status: Former Research scientist (life sciences)  . Smokeless tobacco: Never Used    Substance and Sexual Activity  . Alcohol use: Yes    Comment: glass of wine maybe once a week.  . Drug use: Never  . Sexual activity: Yes  Lifestyle  . Physical activity:    Days per week: Not on file    Minutes per session: Not on file  . Stress: Not on file  Relationships  . Social connections:    Talks on phone: Not on file    Gets together: Not on file    Attends religious service: Not on file    Active member of club or organization: Not on file    Attends meetings of clubs or organizations: Not on file    Relationship status: Not on file  Other Topics Concern  . Not on file  Social History Narrative  . Not on file   Tobacco Counseling Counseling given: Not Answered   Clinical Intake:     Pain : No/denies pain                 Activities of Daily Living In your present state of health, do you have any difficulty performing the following activities: 08/31/2018  Hearing? N  Vision? N  Comment cataract sx 6 yrs ago. wear readers. Eye doctor yearly. Dr.Digby.  Difficulty concentrating or making decisions? N  Walking or climbing stairs? N  Dressing or bathing? N  Doing errands, shopping? N  Preparing Food and eating ? N  Using the Toilet? N  In the past six months, have you accidently leaked urine? N  Do you have problems with loss of bowel control? N  Managing your Medications? N  Managing your Finances? N  Housekeeping or managing your Housekeeping? N     Immunizations and Health Maintenance Immunization History  Administered Date(s) Administered  . Influenza, High Dose Seasonal PF 06/15/2018  . Pneumococcal Conjugate-13 09/18/2015  . Pneumococcal Polysaccharide-23 10/11/2016   Health Maintenance Due  Topic Date Due  . OPHTHALMOLOGY EXAM  12/20/1950  . TETANUS/TDAP  12/19/1960    Patient Care Team: Shelda Pal, DO as PCP - General (Family Medicine)  Indicate any recent Medical Services you may have received from other than  [facility] providers in the past year (date may be approximate).    Assessment:   This is a routine wellness examination for Gary Flynn. Physical assessment deferred to PCP.  Hearing/Vision screen  Visual Acuity Screening   Right eye Left eye Both eyes  Without correction: 20/32 20/25 20/32   With correction:     Hearing Screening Comments: Able to hear conversational tones w/o difficulty. No issues reported.    Dietary issues and exercise activities discussed: Diet (meal preparation, eat out, water intake, caffeinated beverages,  dairy products, fruits and vegetables): well balanced  Current Exercise Habits: Home exercise routine(golf 2x/ week.), Frequency (Times/Week): 2, Intensity: Mild, Exercise limited by: None identified  Goals    . Patient Stated     Play more golf.      Depression Screen PHQ 2/9 Scores 08/31/2018  PHQ - 2 Score 0    Fall Risk Fall Risk  08/31/2018  Falls in the past year? 0     Cognitive Function: Ad8 score reviewed for issues:  Issues making decisions:no  Less interest in hobbies / activities:no  Repeats questions, stories (family complaining):no  Trouble using ordinary gadgets (microwave, computer, phone):no  Forgets the month or year: no  Mismanaging finances: no  Remembering appts:no  Daily problems with thinking and/or memory:no Ad8 score is=0         Screening Tests Health Maintenance  Topic Date Due  . OPHTHALMOLOGY EXAM  12/20/1950  . TETANUS/TDAP  12/19/1960  . HEMOGLOBIN A1C  12/14/2018  . FOOT EXAM  06/16/2019  . INFLUENZA VACCINE  Completed  . PNA vac Low Risk Adult  Completed        Plan:    Please schedule your next medicare wellness visit with me in 1 yr.  Continue to eat heart healthy diet (full of fruits, vegetables, whole grains, lean protein, water--limit salt, fat, and sugar intake) and increase physical activity as tolerated.  Continue doing brain stimulating activities (puzzles, reading, adult coloring books,  staying active) to keep memory sharp.   Bring a copy of your living will and/or healthcare power of attorney to your next office visit.  I have personally reviewed and noted the following in the patient's chart:   . Medical and social history . Use of alcohol, tobacco or illicit drugs  . Current medications and supplements . Functional ability and status . Nutritional status . Physical activity . Advanced directives . List of other physicians . Hospitalizations, surgeries, and ER visits in previous 12 months . Vitals . Screenings to include cognitive, depression, and falls . Referrals and appointments  In addition, I have reviewed and discussed with patient certain preventive protocols, quality metrics, and best practice recommendations. A written personalized care plan for preventive services as well as general preventive health recommendations were provided to patient.     Shela Nevin, [place]   08/31/2018

## 2018-08-31 ENCOUNTER — Ambulatory Visit (INDEPENDENT_AMBULATORY_CARE_PROVIDER_SITE_OTHER): Payer: Medicare Other | Admitting: *Deleted

## 2018-08-31 ENCOUNTER — Encounter: Payer: Self-pay | Admitting: Family Medicine

## 2018-08-31 ENCOUNTER — Ambulatory Visit (INDEPENDENT_AMBULATORY_CARE_PROVIDER_SITE_OTHER): Payer: Medicare Other | Admitting: Family Medicine

## 2018-08-31 ENCOUNTER — Encounter: Payer: Self-pay | Admitting: *Deleted

## 2018-08-31 VITALS — BP 140/78 | HR 71 | Ht 67.0 in | Wt 201.0 lb

## 2018-08-31 VITALS — BP 130/74 | HR 71 | Temp 98.0°F | Ht 67.0 in | Wt 201.0 lb

## 2018-08-31 DIAGNOSIS — M353 Polymyalgia rheumatica: Secondary | ICD-10-CM | POA: Diagnosis not present

## 2018-08-31 DIAGNOSIS — Z0000 Encounter for general adult medical examination without abnormal findings: Secondary | ICD-10-CM

## 2018-08-31 DIAGNOSIS — N401 Enlarged prostate with lower urinary tract symptoms: Secondary | ICD-10-CM | POA: Diagnosis not present

## 2018-08-31 DIAGNOSIS — R350 Frequency of micturition: Secondary | ICD-10-CM

## 2018-08-31 DIAGNOSIS — I252 Old myocardial infarction: Secondary | ICD-10-CM

## 2018-08-31 NOTE — Patient Instructions (Signed)
Please schedule your next medicare wellness visit with me in 1 yr.  Continue to eat heart healthy diet (full of fruits, vegetables, whole grains, lean protein, water--limit salt, fat, and sugar intake) and increase physical activity as tolerated.  Continue doing brain stimulating activities (puzzles, reading, adult coloring books, staying active) to keep memory sharp.   Bring a copy of your living will and/or healthcare power of attorney to your next office visit.   Mr. Gary Flynn , Thank you for taking time to come for your Medicare Wellness Visit. I appreciate your ongoing commitment to your health goals. Please review the following plan we discussed and let me know if I can assist you in the future.   These are the goals we discussed: Goals    . Patient Stated     Play more golf.       This is a list of the screening recommended for you and due dates:  Health Maintenance  Topic Date Due  . Eye exam for diabetics  12/20/1950  . Tetanus Vaccine  12/19/1960  . Hemoglobin A1C  12/14/2018  . Complete foot exam   06/16/2019  . Flu Shot  Completed  . Pneumonia vaccines  Completed    Health Maintenance, Male A healthy lifestyle and preventive care is important for your health and wellness. Ask your health care provider about what schedule of regular examinations is right for you. What should I know about weight and diet? Eat a Healthy Diet  Eat plenty of vegetables, fruits, whole grains, low-fat dairy products, and lean protein.  Do not eat a lot of foods high in solid fats, added sugars, or salt.  Maintain a Healthy Weight Regular exercise can help you achieve or maintain a healthy weight. You should:  Do at least 150 minutes of exercise each week. The exercise should increase your heart rate and make you sweat (moderate-intensity exercise).  Do strength-training exercises at least twice a week.  Watch Your Levels of Cholesterol and Blood Lipids  Have your blood tested for  lipids and cholesterol every 5 years starting at 76 years of age. If you are at high risk for heart disease, you should start having your blood tested when you are 76 years old. You may need to have your cholesterol levels checked more often if: ? Your lipid or cholesterol levels are high. ? You are older than 76 years of age. ? You are at high risk for heart disease.  What should I know about cancer screening? Many types of cancers can be detected early and may often be prevented. Lung Cancer  You should be screened every year for lung cancer if: ? You are a current smoker who has smoked for at least 30 years. ? You are a former smoker who has quit within the past 15 years.  Talk to your health care provider about your screening options, when you should start screening, and how often you should be screened.  Colorectal Cancer  Routine colorectal cancer screening usually begins at 75 years of age and should be repeated every 5-10 years until you are 76 years old. You may need to be screened more often if early forms of precancerous polyps or small growths are found. Your health care provider may recommend screening at an earlier age if you have risk factors for colon cancer.  Your health care provider may recommend using home test kits to check for hidden blood in the stool.  A small camera at the end of  a tube can be used to examine your colon (sigmoidoscopy or colonoscopy). This checks for the earliest forms of colorectal cancer.  Prostate and Testicular Cancer  Depending on your age and overall health, your health care provider may do certain tests to screen for prostate and testicular cancer.  Talk to your health care provider about any symptoms or concerns you have about testicular or prostate cancer.  Skin Cancer  Check your skin from head to toe regularly.  Tell your health care provider about any new moles or changes in moles, especially if: ? There is a change in a mole's  size, shape, or color. ? You have a mole that is larger than a pencil eraser.  Always use sunscreen. Apply sunscreen liberally and repeat throughout the day.  Protect yourself by wearing long sleeves, pants, a wide-brimmed hat, and sunglasses when outside.  What should I know about heart disease, diabetes, and high blood pressure?  If you are 65-38 years of age, have your blood pressure checked every 3-5 years. If you are 4 years of age or older, have your blood pressure checked every year. You should have your blood pressure measured twice-once when you are at a hospital or clinic, and once when you are not at a hospital or clinic. Record the average of the two measurements. To check your blood pressure when you are not at a hospital or clinic, you can use: ? An automated blood pressure machine at a pharmacy. ? A home blood pressure monitor.  Talk to your health care provider about your target blood pressure.  If you are between 69-50 years old, ask your health care provider if you should take aspirin to prevent heart disease.  Have regular diabetes screenings by checking your fasting blood sugar level. ? If you are at a normal weight and have a low risk for diabetes, have this test once every three years after the age of 59. ? If you are overweight and have a high risk for diabetes, consider being tested at a younger age or more often.  A one-time screening for abdominal aortic aneurysm (AAA) by ultrasound is recommended for men aged 36-75 years who are current or former smokers. What should I know about preventing infection? Hepatitis B If you have a higher risk for hepatitis B, you should be screened for this virus. Talk with your health care provider to find out if you are at risk for hepatitis B infection. Hepatitis C Blood testing is recommended for:  Everyone born from [birth year] through 1965.  Anyone with known risk factors for hepatitis C.  Sexually Transmitted Diseases  (STDs)  You should be screened each year for STDs including gonorrhea and chlamydia if: ? You are sexually active and are younger than 76 years of age. ? You are older than 76 years of age and your health care provider tells you that you are at risk for this type of infection. ? Your sexual activity has changed since you were last screened and you are at an increased risk for chlamydia or gonorrhea. Ask your health care provider if you are at risk.  Talk with your health care provider about whether you are at high risk of being infected with HIV. Your health care provider may recommend a prescription medicine to help prevent HIV infection.  What else can I do?  Schedule regular health, dental, and eye exams.  Stay current with your vaccines (immunizations).  Do not use any tobacco products, such as cigarettes, chewing  tobacco, and e-cigarettes. If you need help quitting, ask your health care provider.  Limit alcohol intake to no more than 2 drinks per day. One drink equals 12 ounces of beer, 5 ounces of wine, or 1 ounces of hard liquor.  Do not use street drugs.  Do not share needles.  Ask your health care provider for help if you need support or information about quitting drugs.  Tell your health care provider if you often feel depressed.  Tell your health care provider if you have ever been abused or do not feel safe at home. This information is not intended to replace advice given to you by your health care provider. Make sure you discuss any questions you have with your health care provider. Document Released: 03/04/2008 Document Revised: 05/05/2016 Document Reviewed: 06/10/2015 Elsevier Interactive Patient Education  2018 [employer] Maintenance, Male A healthy lifestyle and preventive care is important for your health and wellness. Ask your health care provider about what schedule of regular examinations is right for you. What should I know about weight and  diet? Eat a Healthy Diet  Eat plenty of vegetables, fruits, whole grains, low-fat dairy products, and lean protein.  Do not eat a lot of foods high in solid fats, added sugars, or salt.  Maintain a Healthy Weight Regular exercise can help you achieve or maintain a healthy weight. You should:  Do at least 150 minutes of exercise each week. The exercise should increase your heart rate and make you sweat (moderate-intensity exercise).  Do strength-training exercises at least twice a week.  Watch Your Levels of Cholesterol and Blood Lipids  Have your blood tested for lipids and cholesterol every 5 years starting at 76 years of age. If you are at high risk for heart disease, you should start having your blood tested when you are 76 years old. You may need to have your cholesterol levels checked more often if: ? Your lipid or cholesterol levels are high. ? You are older than 76 years of age. ? You are at high risk for heart disease.  What should I know about cancer screening? Many types of cancers can be detected early and may often be prevented. Lung Cancer  You should be screened every year for lung cancer if: ? You are a current smoker who has smoked for at least 30 years. ? You are a former smoker who has quit within the past 15 years.  Talk to your health care provider about your screening options, when you should start screening, and how often you should be screened.  Colorectal Cancer  Routine colorectal cancer screening usually begins at 76 years of age and should be repeated every 5-10 years until you are 76 years old. You may need to be screened more often if early forms of precancerous polyps or small growths are found. Your health care provider may recommend screening at an earlier age if you have risk factors for colon cancer.  Your health care provider may recommend using home test kits to check for hidden blood in the stool.  A small camera at the end of a tube can be  used to examine your colon (sigmoidoscopy or colonoscopy). This checks for the earliest forms of colorectal cancer.  Prostate and Testicular Cancer  Depending on your age and overall health, your health care provider may do certain tests to screen for prostate and testicular cancer.  Talk to your health care provider about any symptoms or concerns you have  about testicular or prostate cancer.  Skin Cancer  Check your skin from head to toe regularly.  Tell your health care provider about any new moles or changes in moles, especially if: ? There is a change in a mole's size, shape, or color. ? You have a mole that is larger than a pencil eraser.  Always use sunscreen. Apply sunscreen liberally and repeat throughout the day.  Protect yourself by wearing long sleeves, pants, a wide-brimmed hat, and sunglasses when outside.  What should I know about heart disease, diabetes, and high blood pressure?  If you are 75-49 years of age, have your blood pressure checked every 3-5 years. If you are 20 years of age or older, have your blood pressure checked every year. You should have your blood pressure measured twice-once when you are at a hospital or clinic, and once when you are not at a hospital or clinic. Record the average of the two measurements. To check your blood pressure when you are not at a hospital or clinic, you can use: ? An automated blood pressure machine at a pharmacy. ? A home blood pressure monitor.  Talk to your health care provider about your target blood pressure.  If you are between 28-3 years old, ask your health care provider if you should take aspirin to prevent heart disease.  Have regular diabetes screenings by checking your fasting blood sugar level. ? If you are at a normal weight and have a low risk for diabetes, have this test once every three years after the age of 10. ? If you are overweight and have a high risk for diabetes, consider being tested at a younger  age or more often.  A one-time screening for abdominal aortic aneurysm (AAA) by ultrasound is recommended for men aged 53-75 years who are current or former smokers. What should I know about preventing infection? Hepatitis B If you have a higher risk for hepatitis B, you should be screened for this virus. Talk with your health care provider to find out if you are at risk for hepatitis B infection. Hepatitis C Blood testing is recommended for:  Everyone born from [birth year] through 1964.  Anyone with known risk factors for hepatitis C.  Sexually Transmitted Diseases (STDs)  You should be screened each year for STDs including gonorrhea and chlamydia if: ? You are sexually active and are younger than 76 years of age. ? You are older than 76 years of age and your health care provider tells you that you are at risk for this type of infection. ? Your sexual activity has changed since you were last screened and you are at an increased risk for chlamydia or gonorrhea. Ask your health care provider if you are at risk.  Talk with your health care provider about whether you are at high risk of being infected with HIV. Your health care provider may recommend a prescription medicine to help prevent HIV infection.  What else can I do?  Schedule regular health, dental, and eye exams.  Stay current with your vaccines (immunizations).  Do not use any tobacco products, such as cigarettes, chewing tobacco, and e-cigarettes. If you need help quitting, ask your health care provider.  Limit alcohol intake to no more than 2 drinks per day. One drink equals 12 ounces of beer, 5 ounces of wine, or 1 ounces of hard liquor.  Do not use street drugs.  Do not share needles.  Ask your health care provider for help if you need  support or information about quitting drugs.  Tell your health care provider if you often feel depressed.  Tell your health care provider if you have ever been abused or do not feel safe  at home. This information is not intended to replace advice given to you by your health care provider. Make sure you discuss any questions you have with your health care provider. Document Released: 03/04/2008 Document Revised: 05/05/2016 Document Reviewed: 06/10/2015 Elsevier Interactive Patient Education  [employer].

## 2018-08-31 NOTE — Progress Notes (Signed)
CC: F/u jt pain   Subjective: Patient is a 77 y.o. male here for f/u.  He was placed back on 5 mg of prednisone daily and started on Neurontin.  When he was weaned down from prednisone, 5 mg was still painful, but with the Neurontin, he feels much better.  He is able to make a full fist again.  Pain is very well controlled at this time.  He has a history of BPH, does have some urinary frequency.  He states he wakes up once in the middle the night, 4-5 hours into sleep.  He will pee every 2-4 hours on average.  It does not bother him enough to take a medicine, however his wife wanted him to check.  He has a history of a myocardial infarction for which he takes a baby aspirin daily.  He does have easy bruising from this and is wondering if that is normal.  ROS: MSK: No jt pain  Past Medical History:  Diagnosis Date  . Arthritis   . Colon polyps   . Diabetes mellitus without complication ([place])   . Heart disease   . Heart murmur   . History of MI (myocardial infarction)   . Hyperlipidemia   . Hypertension   . Stroke ([place])     Objective: BP 130/74  General: Awake, appears stated age Lungs: No accessory muscle use Psych: Age appropriate judgment and insight, normal affect and mood  Assessment and Plan: Polymyalgia rheumatica ([place])  Benign prostatic hyperplasia with urinary frequency  History of MI (myocardial infarction)  Cont gabapentin, pred 5 mg/d. Watchful waiting. Cont ASA, reassurance given regarding easy bruising that comes with age and being on aspirin. Follow-up in 6 months or as needed. The patient voiced understanding and agreement to the plan.  [place], DO 08/31/18  11:23 AM

## 2018-08-31 NOTE — Patient Instructions (Addendum)
Let me know when you need refills.  Your urination is actually pretty good for your age. If it becomes frequent enough to start a medication, let me know.  Your bruising is expected given you are on aspirin and older than 65.  Let us know if you need anything.

## 2018-08-31 NOTE — Progress Notes (Signed)
Noted. Agree with above.  [place], DO 08/31/18 11:19 AM

## 2018-09-07 ENCOUNTER — Encounter: Payer: Self-pay | Admitting: Internal Medicine

## 2018-09-25 ENCOUNTER — Encounter: Payer: Self-pay | Admitting: Internal Medicine

## 2018-10-10 ENCOUNTER — Ambulatory Visit (AMBULATORY_SURGERY_CENTER): Payer: Self-pay | Admitting: *Deleted

## 2018-10-10 ENCOUNTER — Other Ambulatory Visit: Payer: Self-pay

## 2018-10-10 VITALS — Ht 66.0 in | Wt 200.6 lb

## 2018-10-10 DIAGNOSIS — Z86010 Personal history of colonic polyps: Secondary | ICD-10-CM

## 2018-10-10 MED ORDER — SUPREP BOWEL PREP KIT 17.5-3.13-1.6 GM/177ML PO SOLN: 1.0000 | Freq: Once | ORAL | 0 refills | Status: AC

## 2018-10-10 NOTE — Progress Notes (Signed)
Patient denies any allergies to egg or soy products. Patient denies complications with anesthesia/sedation.  Patient denies oxygen use at home and denies diet medications. Patient denies information on colonoscopy. 

## 2018-10-24 ENCOUNTER — Encounter: Payer: Self-pay | Admitting: Internal Medicine

## 2018-10-24 ENCOUNTER — Ambulatory Visit (AMBULATORY_SURGERY_CENTER): Payer: Medicare Other | Admitting: Internal Medicine

## 2018-10-24 VITALS — BP 166/97 | HR 73 | Temp 96.9°F | Resp 16 | Ht 66.0 in | Wt 200.0 lb

## 2018-10-24 DIAGNOSIS — E785 Hyperlipidemia, unspecified: Secondary | ICD-10-CM | POA: Diagnosis not present

## 2018-10-24 DIAGNOSIS — I2510 Atherosclerotic heart disease of native coronary artery without angina pectoris: Secondary | ICD-10-CM | POA: Diagnosis not present

## 2018-10-24 DIAGNOSIS — I10 Essential (primary) hypertension: Secondary | ICD-10-CM | POA: Diagnosis not present

## 2018-10-24 DIAGNOSIS — E119 Type 2 diabetes mellitus without complications: Secondary | ICD-10-CM | POA: Diagnosis not present

## 2018-10-24 DIAGNOSIS — Z86010 Personal history of colonic polyps: Secondary | ICD-10-CM

## 2018-10-24 DIAGNOSIS — Z800 Family history of malignant neoplasm of digestive organs: Secondary | ICD-10-CM

## 2018-10-24 DIAGNOSIS — D122 Benign neoplasm of ascending colon: Secondary | ICD-10-CM | POA: Diagnosis not present

## 2018-10-24 DIAGNOSIS — D120 Benign neoplasm of cecum: Secondary | ICD-10-CM

## 2018-10-24 DIAGNOSIS — Z8673 Personal history of transient ischemic attack (TIA), and cerebral infarction without residual deficits: Secondary | ICD-10-CM | POA: Diagnosis not present

## 2018-10-24 MED ORDER — SODIUM CHLORIDE 0.9 % IV SOLN: 500.0000 mL | Freq: Once | INTRAVENOUS | Status: DC

## 2018-10-24 NOTE — Op Note (Signed)
[name] Procedure Date: 10/24/2018 10:53 AM MRN: [id number] Endoscopist: Docia Chuck. Henrene Pastor , MD Age: 78 Referring MD:  Date of Birth: 09/21/1940 Gender: Male Account #: [id number] Procedure:                Colonoscopy with cold snare polypectomy x 3 Indications:              High risk colon cancer surveillance: Personal                            history of multiple (3 or more) adenomas. Multiple                            previous examinations in [place]. Last examination                            December 2014 with nonadvanced adenoma. Also sister                            with a history of colon cancer less than age 40 Medicines:                Monitored Anesthesia Care Procedure:                Pre-Anesthesia Assessment:                           - Prior to the procedure, a History and Physical                            was performed, and patient medications and                            allergies were reviewed. The patient's tolerance of                            previous anesthesia was also reviewed. The risks                            and benefits of the procedure and the sedation                            options and risks were discussed with the patient.                            All questions were answered, and informed consent                            was obtained. Prior Anticoagulants: The patient has                            taken no previous anticoagulant or antiplatelet                            agents. ASA Grade Assessment: II - A patient with  mild systemic disease. After reviewing the risks                            and benefits, the patient was deemed in                            satisfactory condition to undergo the procedure.                           After obtaining informed consent, the colonoscope                            was passed under direct vision. Throughout the                  procedure, the patient's blood pressure, pulse, and                            oxygen saturations were monitored continuously. The                            Colonoscope was introduced through the anus and                            advanced to the the cecum, identified by                            appendiceal orifice and ileocecal valve. The                            terminal ileum, ileocecal valve, appendiceal                            orifice, and rectum were photographed. The quality                            of the bowel preparation was excellent. The                            colonoscopy was performed without difficulty. The                            patient tolerated the procedure well. The bowel                            preparation used was SUPREP. Scope In: 11:05:55 AM Scope Out: 11:20:34 AM Scope Withdrawal Time: 0 hours 12 minutes 1 second  Total Procedure Duration: 0 hours 14 minutes 39 seconds  Findings:                 The terminal ileum appeared normal.                           Three polyps were found in the ascending colon and  cecum. The polyps were 2 to 5 mm in size. These                            polyps were removed with a cold snare. Resection                            and retrieval were complete.                           A few small-mouthed diverticula were found in the                            left colon.                           The exam was otherwise without abnormality on                            direct and retroflexion views. Complications:            No immediate complications. Estimated blood loss:                            None. Estimated Blood Loss:     Estimated blood loss: none. Impression:               - The examined portion of the ileum was normal.                           - Three 2 to 5 mm polyps in the ascending colon and                            in the cecum, removed with a cold snare.  Resected                            and retrieved.                           - Diverticulosis in the left colon.                           - The examination was otherwise normal on direct                            and retroflexion views. Recommendation:           - Repeat colonoscopy in 3 years for surveillance.                           - Patient has a contact number available for                            emergencies. The signs and symptoms of potential                            delayed  complications were discussed with the                            patient. Return to normal activities tomorrow.                            Written discharge instructions were provided to the                            patient.                           - Resume previous diet.                           - Continue present medications.                           - Await pathology results. Docia Chuck. Henrene Pastor, MD 10/24/2018 11:25:55 AM This report has been signed electronically.

## 2018-10-24 NOTE — Progress Notes (Signed)
Pt's states no medical or surgical changes since previsit or office visit. 

## 2018-10-24 NOTE — Patient Instructions (Signed)
Please read handouts provided. Await pathology results. Continue present medications.      YOU HAD AN ENDOSCOPIC PROCEDURE TODAY AT [facility]:   Refer to the procedure report that was given to you for any specific questions about what was found during the examination.  If the procedure report does not answer your questions, please call your gastroenterologist to clarify.  If you requested that your care partner not be given the details of your procedure findings, then the procedure report has been included in a sealed envelope for you to review at your convenience later.  YOU SHOULD EXPECT: Some feelings of bloating in the abdomen. Passage of more gas than usual.  Walking can help get rid of the air that was put into your GI tract during the procedure and reduce the bloating. If you had a lower endoscopy (such as a colonoscopy or flexible sigmoidoscopy) you may notice spotting of blood in your stool or on the toilet paper. If you underwent a bowel prep for your procedure, you may not have a normal bowel movement for a few days.  Please Note:  You might notice some irritation and congestion in your nose or some drainage.  This is from the oxygen used during your procedure.  There is no need for concern and it should clear up in a day or so.  SYMPTOMS TO REPORT IMMEDIATELY:   Following lower endoscopy (colonoscopy or flexible sigmoidoscopy):  Excessive amounts of blood in the stool  Significant tenderness or worsening of abdominal pains  Swelling of the abdomen that is new, acute  Fever of 100F or higher    For urgent or emergent issues, a gastroenterologist can be reached at any hour by calling [phone].   DIET:  We do recommend a small meal at first, but then you may proceed to your regular diet.  Drink plenty of fluids but you should avoid alcoholic beverages for 24 hours.  ACTIVITY:  You should plan to take it easy for the rest of today and you should NOT  DRIVE or use heavy machinery until tomorrow (because of the sedation medicines used during the test).    FOLLOW UP: Our staff will call the number listed on your records the next business day following your procedure to check on you and address any questions or concerns that you may have regarding the information given to you following your procedure. If we do not reach you, we will leave a message.  However, if you are feeling well and you are not experiencing any problems, there is no need to return our call.  We will assume that you have returned to your regular daily activities without incident.  If any biopsies were taken you will be contacted by phone or by letter within the next 1-3 weeks.  Please call us at [phone] if you have not heard about the biopsies in 3 weeks.    SIGNATURES/CONFIDENTIALITY: You and/or your care partner have signed paperwork which will be entered into your electronic medical record.  These signatures attest to the fact that that the information above on your After Visit Summary has been reviewed and is understood.  Full responsibility of the confidentiality of this discharge information lies with you and/or your care-partner.

## 2018-10-24 NOTE — Progress Notes (Signed)
Report to PACU, RN, vss, BBS= Clear.  

## 2018-10-25 ENCOUNTER — Telehealth: Payer: Self-pay | Admitting: *Deleted

## 2018-10-25 NOTE — Telephone Encounter (Signed)
  Follow up Call-  Call back number 10/24/2018  Post procedure Call Back phone  # [phone]  Permission to leave phone message Yes     Patient questions:  Do you have a fever, pain , or abdominal swelling? No. Pain Score  0 *  Have you tolerated food without any problems? Yes.    Have you been able to return to your normal activities? Yes.    Do you have any questions about your discharge instructions: Diet   Yes.   Medications  No. Follow up visit  No.  Do you have questions or concerns about your Care? No.  Actions: * If pain score is 4 or above: No action needed, pain <4.

## 2018-10-26 ENCOUNTER — Encounter: Payer: Self-pay | Admitting: Family Medicine

## 2018-10-26 ENCOUNTER — Encounter: Payer: Self-pay | Admitting: Internal Medicine

## 2018-10-26 DIAGNOSIS — M353 Polymyalgia rheumatica: Secondary | ICD-10-CM

## 2018-10-26 MED ORDER — GABAPENTIN 100 MG PO CAPS: 100.0000 mg | ORAL_CAPSULE | Freq: Three times a day (TID) | ORAL | 5 refills | Status: DC

## 2018-11-28 ENCOUNTER — Encounter: Payer: Self-pay | Admitting: Family Medicine

## 2018-11-28 MED ORDER — ALENDRONATE SODIUM 70 MG PO TABS: 70.0000 mg | ORAL_TABLET | ORAL | 3 refills | Status: DC

## 2018-12-15 ENCOUNTER — Ambulatory Visit: Payer: Medicare Other | Admitting: Family Medicine

## 2018-12-15 ENCOUNTER — Ambulatory Visit: Payer: Medicare Other | Admitting: *Deleted

## 2019-01-03 ENCOUNTER — Other Ambulatory Visit: Payer: Self-pay | Admitting: Family Medicine

## 2019-01-03 ENCOUNTER — Encounter: Payer: Self-pay | Admitting: Family Medicine

## 2019-01-03 MED ORDER — ATORVASTATIN CALCIUM 40 MG PO TABS: 40.0000 mg | ORAL_TABLET | Freq: Every day | ORAL | 1 refills | Status: DC

## 2019-01-15 ENCOUNTER — Encounter: Payer: Self-pay | Admitting: Family Medicine

## 2019-01-15 ENCOUNTER — Ambulatory Visit: Payer: Medicare Other | Admitting: Family Medicine

## 2019-01-15 ENCOUNTER — Ambulatory Visit (INDEPENDENT_AMBULATORY_CARE_PROVIDER_SITE_OTHER): Payer: Medicare Other | Admitting: Family Medicine

## 2019-01-15 ENCOUNTER — Other Ambulatory Visit: Payer: Self-pay

## 2019-01-15 DIAGNOSIS — E785 Hyperlipidemia, unspecified: Secondary | ICD-10-CM | POA: Diagnosis not present

## 2019-01-15 DIAGNOSIS — I10 Essential (primary) hypertension: Secondary | ICD-10-CM

## 2019-01-15 DIAGNOSIS — E119 Type 2 diabetes mellitus without complications: Secondary | ICD-10-CM

## 2019-01-15 DIAGNOSIS — I252 Old myocardial infarction: Secondary | ICD-10-CM

## 2019-01-15 MED ORDER — CARVEDILOL 3.125 MG PO TABS: 3.1250 mg | ORAL_TABLET | Freq: Two times a day (BID) | ORAL | 3 refills | Status: DC

## 2019-01-15 MED ORDER — METFORMIN HCL 500 MG PO TABS: 1000.0000 mg | ORAL_TABLET | Freq: Two times a day (BID) | ORAL | 3 refills | Status: DC

## 2019-01-15 MED ORDER — AMLODIPINE BESYLATE 5 MG PO TABS: 5.0000 mg | ORAL_TABLET | Freq: Every day | ORAL | 3 refills | Status: DC

## 2019-01-15 MED ORDER — GLYBURIDE 5 MG PO TABS: 5.0000 mg | ORAL_TABLET | Freq: Every day | ORAL | 3 refills | Status: DC

## 2019-01-15 MED ORDER — LISINOPRIL-HYDROCHLOROTHIAZIDE 20-12.5 MG PO TABS: 1.0000 | ORAL_TABLET | Freq: Every day | ORAL | 3 refills | Status: DC

## 2019-01-15 NOTE — Progress Notes (Signed)
CC: Med ck  Subjective: Hyperlipidemia Patient presents for Hyperlipidemia follow up. Due to outbreak, we are interacting via web portal for an electronic face-to-face visit. I verified patient's ID using 2 identifiers.  Currently taking Atorvastatin 40 mg/d and compliance with treatment thus far has been good. He denies myalgias. He is adhering to a healthy diet. Exercise: walking The patient is known to have coexisting coronary artery disease.  Hypertension Patient presents for hypertension follow up. He does not monitor home blood pressures. He is compliant with medications- Norvasc 5 mg/d, Prinzide 20-12.5 mg/d, Coreg 3.125 mg bid. Patient has these side effects of medication: none He is adhering to a healthy diet overall. Exercise: some walking  DM Takes Metformin 1000 mg bid, glyburide 5 mg/d. Does not check sugars. On statin. Due for eye exam PCV23 done. Flu shot done. Due for urine test.  ROS: Heart: Denies chest pain or palpitations Lungs: Denies SOB or cough  Past Medical History:  Diagnosis Date  . Anxiety   . Arthritis    hands, lower back  . Cataract    surgery to remove - bilateral  . Colon polyps   . Diabetes mellitus without complication ([place])   . Heart disease   . Heart murmur    no problems  . History of MI (myocardial infarction)   . Hyperlipidemia   . Hypertension   . Stroke [facility])    no deficits - ? mini stroke per patient     Objective: No conversational dyspnea Age appropriate judgment and insight Nml affect and mood  Assessment and Plan: Controlled type 2 diabetes mellitus without complication, without long-term current use of insulin ([place]) - Plan: glyBURIDE (DIABETA) 5 MG tablet, metFORMIN (GLUCOPHAGE) 500 MG tablet, Hemoglobin A1c, Microalbumin / creatinine urine ratio  Essential hypertension - Plan: amLODipine (NORVASC) 5 MG tablet, carvedilol (COREG) 3.125 MG tablet, lisinopril-hydrochlorothiazide (ZESTORETIC) 20-12.5 MG  tablet  Dyslipidemia  History of MI (myocardial infarction) - Plan: carvedilol (COREG) 3.125 MG tablet  Orders as above. Counseled on diet and exercise. Come in for labs next week when wife is here.  Schedule eye exam at earliest convenience.  F/u in 6 mo or prn. The patient voiced understanding and agreement to the plan.  [place], DO 01/15/19  3:44 PM

## 2019-01-17 ENCOUNTER — Other Ambulatory Visit: Payer: Self-pay

## 2019-01-17 ENCOUNTER — Other Ambulatory Visit (INDEPENDENT_AMBULATORY_CARE_PROVIDER_SITE_OTHER): Payer: Medicare Other

## 2019-01-17 DIAGNOSIS — E119 Type 2 diabetes mellitus without complications: Secondary | ICD-10-CM | POA: Diagnosis not present

## 2019-01-17 LAB — MICROALBUMIN / CREATININE URINE RATIO
Creatinine,U: 40.1 mg/dL
Microalb Creat Ratio: 1.7 mg/g (ref 0.0–30.0)
Microalb, Ur: <0.7 mg/dL (ref 0.0–1.9)

## 2019-01-17 LAB — HEMOGLOBIN A1C: Hgb A1c MFr Bld: 7.7 % — ABNORMAL HIGH (ref 4.6–6.5)

## 2019-01-22 ENCOUNTER — Other Ambulatory Visit: Payer: Self-pay | Admitting: Family Medicine

## 2019-01-22 ENCOUNTER — Encounter: Payer: Self-pay | Admitting: Family Medicine

## 2019-01-22 DIAGNOSIS — M353 Polymyalgia rheumatica: Secondary | ICD-10-CM

## 2019-01-22 MED ORDER — PREDNISONE 5 MG PO TABS: 5.0000 mg | ORAL_TABLET | Freq: Every day | ORAL | 1 refills | Status: DC

## 2019-02-06 ENCOUNTER — Encounter: Payer: Self-pay | Admitting: Family Medicine

## 2019-02-06 MED ORDER — ALPRAZOLAM 0.25 MG PO TABS: 0.2500 mg | ORAL_TABLET | Freq: Two times a day (BID) | ORAL | 5 refills | Status: DC | PRN

## 2019-03-12 ENCOUNTER — Ambulatory Visit (INDEPENDENT_AMBULATORY_CARE_PROVIDER_SITE_OTHER): Payer: Medicare Other | Admitting: Family Medicine

## 2019-03-12 ENCOUNTER — Encounter: Payer: Self-pay | Admitting: Family Medicine

## 2019-03-12 ENCOUNTER — Other Ambulatory Visit: Payer: Self-pay

## 2019-03-12 VITALS — BP 112/68 | HR 82 | Temp 97.8°F | Ht 67.0 in | Wt 203.0 lb

## 2019-03-12 DIAGNOSIS — M353 Polymyalgia rheumatica: Secondary | ICD-10-CM | POA: Diagnosis not present

## 2019-03-12 DIAGNOSIS — I252 Old myocardial infarction: Secondary | ICD-10-CM | POA: Diagnosis not present

## 2019-03-12 DIAGNOSIS — R0609 Other forms of dyspnea: Secondary | ICD-10-CM | POA: Diagnosis not present

## 2019-03-12 DIAGNOSIS — R0600 Dyspnea, unspecified: Secondary | ICD-10-CM

## 2019-03-12 LAB — LIPID PANEL
Cholesterol: 137 mg/dL (ref 0–200)
HDL: 42.6 mg/dL (ref >39.00)
LDL Cholesterol: 64 mg/dL (ref 0–99)
NonHDL: 94.19
Total CHOL/HDL Ratio: 3
Triglycerides: 150 mg/dL — ABNORMAL HIGH (ref 0.0–149.0)
VLDL: 30 mg/dL (ref 0.0–40.0)

## 2019-03-12 LAB — COMPREHENSIVE METABOLIC PANEL
ALT: 17 U/L (ref 0–53)
AST: 15 U/L (ref 0–37)
Albumin: 4.3 g/dL (ref 3.5–5.2)
Alkaline Phosphatase: 41 U/L (ref 39–117)
BUN: 34 mg/dL — ABNORMAL HIGH (ref 6–23)
CO2: 27 mEq/L (ref 19–32)
Calcium: 9.6 mg/dL (ref 8.4–10.5)
Chloride: 101 mEq/L (ref 96–112)
Creatinine, Ser: 1.35 mg/dL (ref 0.40–1.50)
GFR: 51.22 mL/min — ABNORMAL LOW (ref >60.00)
Glucose, Bld: 147 mg/dL — ABNORMAL HIGH (ref 70–99)
Potassium: 4.6 mEq/L (ref 3.5–5.1)
Sodium: 138 mEq/L (ref 135–145)
Total Bilirubin: 0.8 mg/dL (ref 0.2–1.2)
Total Protein: 6.4 g/dL (ref 6.0–8.3)

## 2019-03-12 NOTE — Progress Notes (Signed)
Chief Complaint  Patient presents with  . Follow-up    Subjective: Patient is a 78 y.o. male here for f/u.  DOE over past 8 mo. He will take out trash and get some sob when walking up incline. Happens every time. He has just started golfing again, does not have much issue there. Does have a hx of MI. Compliant with medication. Not currently following with cardiologist. No CP, sob, palpitations.  PMR Doing well with daily pred 5 mg/d. No AE's. Also taking Fosamax due to loss of bone density from chronic steroid use.    ROS: Heart: Denies chest pain  Lungs: Denies current SOB   Past Medical History:  Diagnosis Date  . Anxiety   . Arthritis    hands, lower back  . Cataract    surgery to remove - bilateral  . Colon polyps   . Diabetes mellitus without complication ([place])   . Heart disease   . Heart murmur    no problems  . History of MI (myocardial infarction)   . Hyperlipidemia   . Hypertension   . Stroke [facility])    no deficits - ? mini stroke per patient     Objective: BP 112/68 (BP Location: Left Arm, Patient Position: Sitting, Cuff Size: Normal)   Pulse 82   Temp 97.8 F (36.6 C) (Oral)   Ht 5\' 7"  (1.702 m)   Wt 203 lb (92.1 kg)   SpO2 95%   BMI 31.79 kg/m  General: Awake, appears stated age HEENT: MMM, EOMi Heart: RRR, no bruits, no LE edema Lungs: CTAB, no rales, wheezes or rhonchi. No accessory muscle use Psych: Age appropriate judgment and insight, normal affect and mood  Assessment and Plan: DOE (dyspnea on exertion) - Plan: EKG 12-Lead, Ambulatory referral to Cardiology, EKG NSR, no interval abn, signs of ischemia, nml R wave progression  Polymyalgia rheumatica ([facility]) - Plan: cont pred  History of MI (myocardial infarction) - Plan: Lipid panel, Comprehensive metabolic panel, Ambulatory referral to Cardiology,  Orders as above. The patient voiced understanding and agreement to the plan.  [place], DO 03/12/19  11:20 AM

## 2019-03-12 NOTE — Patient Instructions (Addendum)
Give us 2-3 business days to get the results of your labs back.   Keep the diet clean and stay active.  If you do not hear anything about your referral in the next 1-2 weeks, call our office and ask for an update.  Let us know if you need anything. 

## 2019-03-12 NOTE — Progress Notes (Signed)
Cardiology Office Note:    Date:  03/13/2019   ID:  Gary Flynn, DOB 09-Sep-1941, MRN [id number]  PCP:  Gary Pal, DO  Cardiologist:  Gary More, MD   Referring MD: Gary Flynn*  ASSESSMENT:    1. SOB (shortness of breath) on exertion   2. RBBB   3. Coronary artery disease of bypass graft of native heart with stable angina pectoris (Gary Flynn)   4. Mixed hyperlipidemia   5. Controlled type 2 diabetes mellitus without complication, without long-term current use of insulin ([place])   6. Essential hypertension    PLAN:    In order of problems listed above:  1. His primary problem is exertional shortness of breath that may well be anginal equivalent.  He is also at risk for heart failure following remote bypass surgery although he has no physical findings of edema or neck vein distention or cardiac gallop.  For evaluation undergo an echocardiogram with close attention to left heart systolic and diastolic function and valvular abnormality.  He will also undergo myocardial perfusion study if he has demonstrable ischemia would benefit from coronary angiography and further revascularization.  He will continue his current medical treatment which is appropriate with aspirin high intensity statin diabetic medications and anti-hypertensives.  He has a prescription for nitroglycerin if needed.  We will request records if available from his bypass but I understand as we approached 3 decades we not may not be able to access these. 2. Right bundle branch block noted on EKG 3. Hyperlipidemia stable continue his high intensity statin lipids at target 4. Hypertension stable continue current treatment including ACE thiazide diuretic 5. Diabetes stable if he requires an additional agent either GLP-1 agonist or SGLT2 antagonist would be appropriate from a cardiovascular perspective  Next appointment in 3 weeks after testing results   Medication Adjustments/Labs and Tests Ordered:  Current medicines are reviewed at length with the patient today.  Concerns regarding medicines are outlined above.  Orders Placed This Encounter  Procedures  . MYOCARDIAL PERFUSION IMAGING  . ECHOCARDIOGRAM COMPLETE   No orders of the defined types were placed in this encounter.    Chief Complaint  Patient presents with  . Coronary Artery Disease    to establish cardiology care    History of Present Illness:    Gary Flynn is a 78 y.o. male with CAD and CABG who is being seen today for the evaluation of exertional SOB at the request of Gary Flynn*. His cardiology care was at [place] in [place] unfortunately unavailable records in epic care everywhere.  He sustained myocardial infarction and had bypass surgery in 1991 in [place].  Since then he is done well he had 2 stress test done in the last was perhaps 20 years ago.  He had no arrhythmia heart failure and has not had repeat revascularization.  He has been under increased stress recently his wife has recurrent colorectal cancer pending repeat surgery and he is noticed when he comes up his driveway with the trash he developed shortness of breath.  He also occurs when he does garden work.  It is quite severe and is caused him to have to stop and call us in a few minutes to recovery and may well be anginal equivalent.  Prior to bypass surgery he never had typical angina.  He has had no chest pain or pressure no palpitations syncope or TIA he has no edema orthopnea no cough or wheezing.  He is concerned that  he has recurrent ischemia.  Past Medical History:  Diagnosis Date  . Anxiety   . Arthritis    hands, lower back  . Benign prostatic hyperplasia with urinary frequency 03/08/2018  . Cataract    surgery to remove - bilateral  . Colon polyps   . Controlled type 2 diabetes mellitus without complication, without long-term current use of insulin ([place]) 01/11/2018  . Coronary artery disease involving  coronary bypass graft 02/17/2018  . Diabetes mellitus without complication ([place])   . Heart disease   . Heart murmur    no problems  . History of MI (myocardial infarction)   . Hx of CABG 02/17/2018  . Hyperlipidemia   . Hypertension   . Long term (current) use of systemic steroids 01/11/2018  . Polymyalgia rheumatica ([place]) 01/11/2018  . Seborrheic keratoses 03/08/2018  . Stroke [facility])    no deficits - ? mini stroke per patient     Past Surgical History:  Procedure Laterality Date  . COLONOSCOPY  08/2013   hx polyps  . CORONARY ARTERY BYPASS GRAFT    . EYE SURGERY Bilateral    cataracts removed   . HERNIA REPAIR  1960  . triple bypass  1991  . UPPER GASTROINTESTINAL ENDOSCOPY     normal  . WISDOM TOOTH EXTRACTION      Current Medications: Current Meds  Medication Sig  . alendronate (FOSAMAX) 70 MG tablet Take 1 tablet (70 mg total) by mouth once a week. Take with a full glass of water on an empty stomach.  . ALPRAZolam (XANAX) 0.25 MG tablet Take 1 tablet (0.25 mg total) by mouth 2 (two) times daily as needed for anxiety.  Marland Kitchen amLODipine (NORVASC) 5 MG tablet Take 1 tablet (5 mg total) by mouth daily.  Marland Kitchen aspirin EC 81 MG tablet Take 81 mg by mouth daily.  Marland Kitchen atorvastatin (LIPITOR) 40 MG tablet Take 1 tablet (40 mg total) by mouth daily.  . Calcium-Magnesium-Vitamin D (CALCIUM 1200+D3 PO) Take 1 capsule by mouth daily.  . carvedilol (COREG) 3.125 MG tablet Take 1 tablet (3.125 mg total) by mouth 2 (two) times daily with a meal.  . gabapentin (NEURONTIN) 100 MG capsule Take 1 capsule (100 mg total) by mouth 3 (three) times daily.  Marland Kitchen glyBURIDE (DIABETA) 5 MG tablet Take 1 tablet (5 mg total) by mouth daily with breakfast.  . lisinopril-hydrochlorothiazide (ZESTORETIC) 20-12.5 MG tablet Take 1 tablet by mouth daily.  . metFORMIN (GLUCOPHAGE) 500 MG tablet Take 2 tablets (1,000 mg total) by mouth 2 (two) times daily with a meal. Take 2 tablets twice daily  . nitroGLYCERIN (NITROSTAT) 0.4  MG SL tablet Place 1 tablet (0.4 mg total) under the tongue daily as needed for chest pain.  . predniSONE (DELTASONE) 5 MG tablet Take 1 tablet (5 mg total) by mouth daily with breakfast.     Allergies:   Penicillins, Tetanus toxoids, and Ciprofloxacin   Social History   Socioeconomic History  . Marital status: Married    Spouse name: Not on file  . Number of children: Not on file  . Years of education: Not on file  . Highest education level: Not on file  Occupational History  . Not on file  Social Needs  . Financial resource strain: Not on file  . Food insecurity    Worry: Not on file    Inability: Not on file  . Transportation needs    Medical: Not on file    Non-medical: Not on file  Tobacco  Use  . Smoking status: Former Smoker    Packs/day: 3.00    Years: 25.00    Pack years: 75.00    Types: Cigarettes    Quit date: 1980    Years since quitting: 40.5  . Smokeless tobacco: Never Used  Substance and Sexual Activity  . Alcohol use: Yes    Alcohol/week: 1.0 standard drinks    Types: 1 Glasses of wine per week    Comment: glass of wine maybe once a week.  . Drug use: Never  . Sexual activity: Yes  Lifestyle  . Physical activity    Days per week: Not on file    Minutes per session: Not on file  . Stress: Not on file  Relationships  . Social Herbalist on phone: Not on file    Gets together: Not on file    Attends religious service: Not on file    Active member of club or organization: Not on file    Attends meetings of clubs or organizations: Not on file    Relationship status: Not on file  Other Topics Concern  . Not on file  Social History Narrative  . Not on file     Family History: The patient's family history includes Alcohol abuse in his sister; COPD in his sister; Cancer in his mother and sister; Colon cancer in his sister; Diabetes in his sister; Early death in his sister; Heart attack in his father; Heart disease in his father. There is no  history of Rectal cancer, Stomach cancer, or Esophageal cancer.  ROS:   Review of Systems  Constitution: Negative.  HENT: Negative.   Eyes: Negative.   Cardiovascular: Negative.   Respiratory: Positive for shortness of breath.   Endocrine: Negative.   Hematologic/Lymphatic: Negative.   Skin: Negative.   Musculoskeletal: Negative.   Gastrointestinal: Negative.   Genitourinary: Negative.   Neurological: Negative.   Psychiatric/Behavioral: The patient is nervous/anxious (re his wife).   Allergic/Immunologic: Negative.    Please see the history of present illness.     All other systems reviewed and are negative.  EKGs/Labs/Other Studies Reviewed:    The following studies were reviewed today:   EKG:  EKG 03/12/2019  is personally reviewed and demonstrates sinus rhythm first-degree AV block right bundle branch block marked right axis deviation  Recent Labs: 06/15/2018: Hemoglobin 13.4; Platelets 267.0 03/12/2019: ALT 17; BUN 34; Creatinine, Ser 1.35; Potassium 4.6; Sodium 138    Ref Range & Units 61mo ago 11mo ago  Hgb A1c MFr Bld 4.6 - 6.5 % 7.7High   6.9High     Recent Lipid Panel    Component Value Date/Time   CHOL 137 03/12/2019 1113   TRIG 150.0 (H) 03/12/2019 1113   HDL 42.60 03/12/2019 1113   CHOLHDL 3 03/12/2019 1113   VLDL 30.0 03/12/2019 1113   LDLCALC 64 03/12/2019 1113    Physical Exam:    VS:  BP 122/72 (BP Location: Left Arm, Patient Position: Sitting, Cuff Size: Normal)   Pulse 74   Temp 98.2 F (36.8 C)   Ht 5\' 7"  (1.702 m)   Wt 205 lb 1.9 oz (93 kg)   SpO2 96%   BMI 32.13 kg/m     Wt Readings from Last 3 Encounters:  03/13/19 205 lb 1.9 oz (93 kg)  03/12/19 203 lb (92.1 kg)  10/24/18 200 lb (90.7 kg)     GEN:  Well nourished, well developed in no acute distress HEENT: Normal NECK: No  JVD; No carotid bruits LYMPHATICS: No lymphadenopathy CARDIAC: RRR, no murmurs, rubs, gallops RESPIRATORY:  Clear to auscultation without rales, wheezing or  rhonchi  ABDOMEN: Soft, non-tender, non-distended MUSCULOSKELETAL:  No edema; No deformity  SKIN: Warm and dry NEUROLOGIC:  Alert and oriented x 3 PSYCHIATRIC:  Normal affect     Signed, Gary More, MD  03/13/2019 11:55 AM    [place]

## 2019-03-13 ENCOUNTER — Encounter: Payer: Self-pay | Admitting: Cardiology

## 2019-03-13 ENCOUNTER — Encounter: Payer: Self-pay | Admitting: *Deleted

## 2019-03-13 ENCOUNTER — Ambulatory Visit (INDEPENDENT_AMBULATORY_CARE_PROVIDER_SITE_OTHER): Payer: Medicare Other | Admitting: Cardiology

## 2019-03-13 VITALS — BP 122/72 | HR 74 | Temp 98.2°F | Ht 67.0 in | Wt 205.1 lb

## 2019-03-13 DIAGNOSIS — I10 Essential (primary) hypertension: Secondary | ICD-10-CM | POA: Insufficient documentation

## 2019-03-13 DIAGNOSIS — I4510 Unspecified right bundle-branch block: Secondary | ICD-10-CM | POA: Diagnosis not present

## 2019-03-13 DIAGNOSIS — E782 Mixed hyperlipidemia: Secondary | ICD-10-CM

## 2019-03-13 DIAGNOSIS — I25708 Atherosclerosis of coronary artery bypass graft(s), unspecified, with other forms of angina pectoris: Secondary | ICD-10-CM

## 2019-03-13 DIAGNOSIS — R0609 Other forms of dyspnea: Secondary | ICD-10-CM | POA: Insufficient documentation

## 2019-03-13 DIAGNOSIS — E785 Hyperlipidemia, unspecified: Secondary | ICD-10-CM | POA: Insufficient documentation

## 2019-03-13 DIAGNOSIS — R0602 Shortness of breath: Secondary | ICD-10-CM | POA: Diagnosis not present

## 2019-03-13 DIAGNOSIS — R0600 Dyspnea, unspecified: Secondary | ICD-10-CM | POA: Insufficient documentation

## 2019-03-13 DIAGNOSIS — E119 Type 2 diabetes mellitus without complications: Secondary | ICD-10-CM

## 2019-03-13 NOTE — Patient Instructions (Addendum)
Medication Instructions:  Your physician recommends that you continue on your current medications as directed. Please refer to the Current Medication list given to you today.  If you need a refill on your cardiac medications before your next appointment, please call your pharmacy.   Lab work: NOne If you have labs (blood work) drawn today and your tests are completely normal, you will receive your results only by: Marland Kitchen MyChart Message (if you have MyChart) OR . A paper copy in the mail If you have any lab test that is abnormal or we need to change your treatment, we will call you to review the results.  Testing/Procedures: Your physician has requested that you have a lexiscan myoview. For further information please visit [email or web address]. Please follow instruction sheet, as given.  Your physician has requested that you have an echocardiogram. Echocardiography is a painless test that uses sound waves to create images of your heart. It provides your doctor with information about the size and shape of your heart and how well your heart's chambers and valves are working. This procedure takes approximately one hour. There are no restrictions for this procedure.    Follow-Up: At [facility], you and your health needs are our priority.  As part of our continuing mission to provide you with exceptional heart care, we have created designated Provider Care Teams.  These Care Teams include your primary Cardiologist (physician) and Advanced Practice Providers (APPs -  Physician Assistants and Nurse Practitioners) who all work together to provide you with the care you need, when you need it. . You will need a follow up appointment in 3 weeks. }  Any Other Special Instructions Will Be Listed Below (If Applicable).   Cardiac Nuclear Scan A cardiac nuclear scan is a test that is done to check the flow of blood to your heart. It is done when you are resting and when you are exercising. The test looks  for problems such as:  Not enough blood reaching a portion of the heart.  The heart muscle not working as it should. You may need this test if:  You have heart disease.  You have had lab results that are not normal.  You have had heart surgery or a balloon procedure to open up blocked arteries (angioplasty).  You have chest pain.  You have shortness of breath. In this test, a special dye (tracer) is put into your bloodstream. The tracer will travel to your heart. A camera will then take pictures of your heart to see how the tracer moves through your heart. This test is usually done at a hospital and takes 2-4 hours. Tell a doctor about:  Any allergies you have.  All medicines you are taking, including vitamins, herbs, eye drops, creams, and over-the-counter medicines.  Any problems you or family members have had with anesthetic medicines.  Any blood disorders you have.  Any surgeries you have had.  Any medical conditions you have.  Whether you are pregnant or may be pregnant. What are the risks? Generally, this is a safe test. However, problems may occur, such as:  Serious chest pain and heart attack. This is only a risk if the stress portion of the test is done.  Rapid heartbeat.  A feeling of warmth in your chest. This feeling usually does not last long.  Allergic reaction to the tracer. What happens before the test?  Ask your doctor about changing or stopping your normal medicines. This is important.  Follow instructions from your doctor  about what you cannot eat or drink.  Remove your jewelry on the day of the test. What happens during the test?  An IV tube will be inserted into one of your veins.  Your doctor will give you a small amount of tracer through the IV tube.  You will wait for 20-40 minutes while the tracer moves through your bloodstream.  Your heart will be monitored with an electrocardiogram (ECG).  You will lie down on an exam  table.  Pictures of your heart will be taken for about 15-20 minutes.  You may also have a stress test. For this test, one of these things may be done: ? You will be asked to exercise on a treadmill or a stationary bike. ? You will be given medicines that will make your heart work harder. This is done if you are unable to exercise.  When blood flow to your heart has peaked, a tracer will again be given through the IV tube.  After 20-40 minutes, you will get back on the exam table. More pictures will be taken of your heart.  Depending on the tracer that is used, more pictures may need to be taken 3-4 hours later.  Your IV tube will be removed when the test is over. The test may vary among doctors and hospitals. What happens after the test?  Ask your doctor: ? Whether you can return to your normal schedule, including diet, activities, and medicines. ? Whether you should drink more fluids. This will help to remove the tracer from your body. Drink enough fluid to keep your pee (urine) pale yellow.  Ask your doctor, or the department that is doing the test: ? When will my results be ready? ? How will I get my results? Summary  A cardiac nuclear scan is a test that is done to check the flow of blood to your heart.  Tell your doctor whether you are pregnant or may be pregnant.  Before the test, ask your doctor about changing or stopping your normal medicines. This is important.  Ask your doctor whether you can return to your normal activities. You may be asked to drink more fluids. This information is not intended to replace advice given to you by your health care provider. Make sure you discuss any questions you have with your health care provider. Document Released: 02/20/2018 Document Revised: 02/20/2018 Document Reviewed: 02/20/2018 Elsevier Interactive Patient Education  2019 [employer].   Echocardiogram An echocardiogram is a procedure that uses painless sound waves  (ultrasound) to produce an image of the heart. Images from an echocardiogram can provide important information about:  Signs of coronary artery disease (CAD).  Aneurysm detection. An aneurysm is a weak or damaged part of an artery wall that bulges out from the normal force of blood pumping through the body.  Heart size and shape. Changes in the size or shape of the heart can be associated with certain conditions, including heart failure, aneurysm, and CAD.  Heart muscle function.  Heart valve function.  Signs of a past heart attack.  Fluid buildup around the heart.  Thickening of the heart muscle.  A tumor or infectious growth around the heart valves. Tell a health care provider about:  Any allergies you have.  All medicines you are taking, including vitamins, herbs, eye drops, creams, and over-the-counter medicines.  Any blood disorders you have.  Any surgeries you have had.  Any medical conditions you have.  Whether you are pregnant or may be pregnant.  What are the risks? Generally, this is a safe procedure. However, problems may occur, including:  Allergic reaction to dye (contrast) that may be used during the procedure. What happens before the procedure? No specific preparation is needed. You may eat and drink normally. What happens during the procedure?   An IV tube may be inserted into one of your veins.  You may receive contrast through this tube. A contrast is an injection that improves the quality of the pictures from your heart.  A gel will be applied to your chest.  A wand-like tool (transducer) will be moved over your chest. The gel will help to transmit the sound waves from the transducer.  The sound waves will harmlessly bounce off of your heart to allow the heart images to be captured in real-time motion. The images will be recorded on a computer. The procedure may vary among health care providers and hospitals. What happens after the  procedure?  You may return to your normal, everyday life, including diet, activities, and medicines, unless your health care provider tells you not to do that. Summary  An echocardiogram is a procedure that uses painless sound waves (ultrasound) to produce an image of the heart.  Images from an echocardiogram can provide important information about the size and shape of your heart, heart muscle function, heart valve function, and fluid buildup around your heart.  You do not need to do anything to prepare before this procedure. You may eat and drink normally.  After the echocardiogram is completed, you may return to your normal, everyday life, unless your health care provider tells you not to do that. This information is not intended to replace advice given to you by your health care provider. Make sure you discuss any questions you have with your health care provider. Document Released: 09/03/2000 Document Revised: 10/09/2016 Document Reviewed: 10/09/2016 Elsevier Interactive Patient Education  2019 [employer].

## 2019-03-19 ENCOUNTER — Telehealth (HOSPITAL_COMMUNITY): Payer: Self-pay | Admitting: *Deleted

## 2019-03-19 NOTE — Telephone Encounter (Signed)
Patient given detailed instructions per Myocardial Perfusion Study Information Sheet for the test on 03/22/19 at 7:15. Patient notified to arrive 15 minutes early and that it is imperative to arrive on time for appointment to keep from having the test rescheduled.  If you need to cancel or reschedule your appointment, please call the office within 24 hours of your appointment. . Patient verbalized understanding.Gary Flynn

## 2019-03-20 ENCOUNTER — Ambulatory Visit (HOSPITAL_BASED_OUTPATIENT_CLINIC_OR_DEPARTMENT_OTHER)
Admission: RE | Admit: 2019-03-20 | Discharge: 2019-03-20 | Disposition: A | Discharge status: Home / Self Care | Payer: Medicare Other | Source: Ambulatory Visit | Attending: Cardiology | Admitting: Cardiology

## 2019-03-20 ENCOUNTER — Other Ambulatory Visit: Payer: Self-pay

## 2019-03-20 DIAGNOSIS — I25708 Atherosclerosis of coronary artery bypass graft(s), unspecified, with other forms of angina pectoris: Secondary | ICD-10-CM

## 2019-03-20 NOTE — Progress Notes (Signed)
  Echocardiogram 2D Echocardiogram has been performed.  Gary Flynn 03/20/2019, 2:41 PM

## 2019-03-21 ENCOUNTER — Telehealth: Payer: Self-pay

## 2019-03-21 NOTE — Telephone Encounter (Signed)
-----   Message from Richardo Priest, MD sent at 03/21/2019 11:14 AM EDT ----- Yes I would like him to do it ----- Message ----- From: Stevan Born, [place]: 03/21/2019  11:08 AM EDT To: Richardo Priest, MD  Dr Bettina Gavia,   Patient is scheduled to have lexiscan myoview tomorrow.  With his echocardiogram findings he wanted to make sure it is still ok to have lexmyo.  Please advise.  Thank you, Elmyra Ricks

## 2019-03-21 NOTE — Telephone Encounter (Signed)
Left message on voicemail advising patient to proceed.  Advised to contact our office with any question or concerns.

## 2019-03-22 ENCOUNTER — Ambulatory Visit (HOSPITAL_COMMUNITY): Payer: Medicare Other | Attending: Cardiovascular Disease

## 2019-03-22 ENCOUNTER — Other Ambulatory Visit: Payer: Self-pay

## 2019-03-22 VITALS — Ht 67.0 in | Wt 205.0 lb

## 2019-03-22 DIAGNOSIS — I25708 Atherosclerosis of coronary artery bypass graft(s), unspecified, with other forms of angina pectoris: Secondary | ICD-10-CM

## 2019-03-22 DIAGNOSIS — I25700 Atherosclerosis of coronary artery bypass graft(s), unspecified, with unstable angina pectoris: Secondary | ICD-10-CM | POA: Insufficient documentation

## 2019-03-22 DIAGNOSIS — R0602 Shortness of breath: Secondary | ICD-10-CM | POA: Diagnosis not present

## 2019-03-22 LAB — MYOCARDIAL PERFUSION IMAGING
LV dias vol: 77 mL (ref 62–150)
LV sys vol: 28 mL
Peak HR: 108 {beats}/min
Rest HR: 73 {beats}/min
SDS: 3
SRS: 0
SSS: 3
TID: 0.88

## 2019-03-22 MED ORDER — TECHNETIUM TC 99M TETROFOSMIN IV KIT
10.2000 | PACK | Freq: Once | INTRAVENOUS | Status: AC | PRN
  Administered 2019-03-22: 10.2 via INTRAVENOUS
  Filled 2019-03-22: qty 11

## 2019-03-22 MED ORDER — REGADENOSON 0.4 MG/5ML IV SOLN
0.4000 mg | Freq: Once | INTRAVENOUS | Status: AC
  Administered 2019-03-22: 0.4 mg via INTRAVENOUS

## 2019-03-22 MED ORDER — TECHNETIUM TC 99M TETROFOSMIN IV KIT
32.5000 | PACK | Freq: Once | INTRAVENOUS | Status: AC | PRN
  Administered 2019-03-22: 32.5 via INTRAVENOUS
  Filled 2019-03-22: qty 33

## 2019-04-08 NOTE — Progress Notes (Signed)
Cardiology Office Note:    Date:  04/09/2019   ID:  Gary Flynn, DOB 12/31/1940, MRN [id number]  PCP:  Shelda Pal, DO  Cardiologist:  Shirlee More, MD    Referring MD: Shelda Pal*    ASSESSMENT:    1. Coronary artery disease of bypass graft of native heart with stable angina pectoris ([place])   2. Essential hypertension   3. Mixed hyperlipidemia   4. Nonrheumatic aortic valve stenosis    PLAN:    In order of problems listed above:  1. Stable CAD he is reassured by his myocardial perfusion study we reviewed together he is having no anginal discomfort New York Heart Association class I and continue medical treatment 2. Stable hypertension BP is at target continue treatment including beta-blocker ACE inhibitor diuretic 3. Stable hyperlipidemia continue his high intensity statin lipids are at target 4. He has mild aortic stenosis I reassured him is asymptomatic progression is usually slow I do not think he will need intervention in the intermediate future and we will plan a repeat echocardiogram in 1 year   Next appointment: 1 year   Medication Adjustments/Labs and Tests Ordered: Current medicines are reviewed at length with the patient today.  Concerns regarding medicines are outlined above.  No orders of the defined types were placed in this encounter.  Meds ordered this encounter  Medications  . nitroGLYCERIN (NITROSTAT) 0.4 MG SL tablet    Sig: Place 1 tablet (0.4 mg total) under the tongue daily as needed for chest pain.    Dispense:  30 tablet    Refill:  5    Chief Complaint  Patient presents with  . Follow-up    after tests for   . Coronary Artery Disease    History of Present Illness:    Gary Flynn is a 78 y.o. male with a hx of CAD and CABG following myocardial infarction in 62 in [place].  He was last seen 03/13/2019 for evaluation of exertional shortness of breath with concerns of anginal equivalent symptoms..  His  echocardiogram performed 03/20/2019 showed normal left ventricular systolic function ejection fraction 60 to 65% with moderate concentric left ventricular hypertrophy other findings included mild aortic stenosis and mild dilatation of the ascending aorta maximum diameter 37 mm.  On 03/21/2020 underwent pharmacologic myocardial perfusion study with a Lexiscan ejection fraction 63% there were no ischemic ST changes during the test he had normal perfusion and normal function. Compliance with diet, lifestyle and medications: Yes Past Medical History:  Diagnosis Date  . Anxiety   . Arthritis    hands, lower back  . Benign prostatic hyperplasia with urinary frequency 03/08/2018  . Cataract    surgery to remove - bilateral  . Colon polyps   . Controlled type 2 diabetes mellitus without complication, without long-term current use of insulin ([place]) 01/11/2018  . Coronary artery disease involving coronary bypass graft 02/17/2018  . Diabetes mellitus without complication ([place])   . Heart disease   . Heart murmur    no problems  . History of MI (myocardial infarction)   . Hx of CABG 02/17/2018  . Hyperlipidemia   . Hypertension   . Long term (current) use of systemic steroids 01/11/2018  . Polymyalgia rheumatica ([place]) 01/11/2018  . Seborrheic keratoses 03/08/2018  . Stroke [facility])    no deficits - ? mini stroke per patient     Past Surgical History:  Procedure Laterality Date  . COLONOSCOPY  08/2013   hx polyps  . CORONARY  ARTERY BYPASS GRAFT    . EYE SURGERY Bilateral    cataracts removed   . HERNIA REPAIR  1960  . triple bypass  1991  . UPPER GASTROINTESTINAL ENDOSCOPY     normal  . WISDOM TOOTH EXTRACTION      Current Medications: Current Meds  Medication Sig  . alendronate (FOSAMAX) 70 MG tablet Take 1 tablet (70 mg total) by mouth once a week. Take with a full glass of water on an empty stomach.  . ALPRAZolam (XANAX) 0.25 MG tablet Take 1 tablet (0.25 mg total) by mouth 2 (two) times  daily as needed for anxiety.  Marland Kitchen amLODipine (NORVASC) 5 MG tablet Take 1 tablet (5 mg total) by mouth daily.  Marland Kitchen aspirin EC 81 MG tablet Take 81 mg by mouth daily.  Marland Kitchen atorvastatin (LIPITOR) 40 MG tablet Take 1 tablet (40 mg total) by mouth daily.  . Calcium-Magnesium-Vitamin D (CALCIUM 1200+D3 PO) Take 1 capsule by mouth daily.  . carvedilol (COREG) 3.125 MG tablet Take 1 tablet (3.125 mg total) by mouth 2 (two) times daily with a meal.  . gabapentin (NEURONTIN) 100 MG capsule Take 1 capsule (100 mg total) by mouth 3 (three) times daily.  Marland Kitchen glyBURIDE (DIABETA) 5 MG tablet Take 1 tablet (5 mg total) by mouth daily with breakfast.  . lisinopril-hydrochlorothiazide (ZESTORETIC) 20-12.5 MG tablet Take 1 tablet by mouth daily.  . metFORMIN (GLUCOPHAGE) 500 MG tablet Take 2 tablets (1,000 mg total) by mouth 2 (two) times daily with a meal. Take 2 tablets twice daily  . nitroGLYCERIN (NITROSTAT) 0.4 MG SL tablet Place 1 tablet (0.4 mg total) under the tongue daily as needed for chest pain.  . predniSONE (DELTASONE) 5 MG tablet Take 1 tablet (5 mg total) by mouth daily with breakfast.  . [DISCONTINUED] nitroGLYCERIN (NITROSTAT) 0.4 MG SL tablet Place 1 tablet (0.4 mg total) under the tongue daily as needed for chest pain.     Allergies:   Penicillins, Tetanus toxoids, and Ciprofloxacin   Social History   Socioeconomic History  . Marital status: Married    Spouse name: Not on file  . Number of children: Not on file  . Years of education: Not on file  . Highest education level: Not on file  Occupational History  . Not on file  Social Needs  . Financial resource strain: Not on file  . Food insecurity    Worry: Not on file    Inability: Not on file  . Transportation needs    Medical: Not on file    Non-medical: Not on file  Tobacco Use  . Smoking status: Former Smoker    Packs/day: 3.00    Years: 25.00    Pack years: 75.00    Types: Cigarettes    Quit date: 1980    Years since quitting:  40.5  . Smokeless tobacco: Never Used  Substance and Sexual Activity  . Alcohol use: Yes    Alcohol/week: 1.0 standard drinks    Types: 1 Glasses of wine per week    Comment: glass of wine maybe once a week.  . Drug use: Never  . Sexual activity: Yes  Lifestyle  . Physical activity    Days per week: Not on file    Minutes per session: Not on file  . Stress: Not on file  Relationships  . Social Herbalist on phone: Not on file    Gets together: Not on file    Attends religious service: Not  on file    Active member of club or organization: Not on file    Attends meetings of clubs or organizations: Not on file    Relationship status: Not on file  Other Topics Concern  . Not on file  Social History Narrative  . Not on file     Family History: The patient's family history includes Alcohol abuse in his sister; COPD in his sister; Cancer in his mother and sister; Colon cancer in his sister; Diabetes in his sister; Early death in his sister; Heart attack in his father; Heart disease in his father. There is no history of Rectal cancer, Stomach cancer, or Esophageal cancer. ROS:   Please see the history of present illness.    All other systems reviewed and are negative.  EKGs/Labs/Other Studies Reviewed:    The following studies were reviewed today:   Recent Labs: 06/15/2018: Hemoglobin 13.4; Platelets 267.0 03/12/2019: ALT 17; BUN 34; Creatinine, Ser 1.35; Potassium 4.6; Sodium 138  Recent Lipid Panel    Component Value Date/Time   CHOL 137 03/12/2019 1113   TRIG 150.0 (H) 03/12/2019 1113   HDL 42.60 03/12/2019 1113   CHOLHDL 3 03/12/2019 1113   VLDL 30.0 03/12/2019 1113   LDLCALC 64 03/12/2019 1113    Physical Exam:    VS:  BP 116/78 (BP Location: Right Arm, Patient Position: Sitting, Cuff Size: Normal)   Pulse 69   Temp (!) 97.2 F (36.2 C)   Ht 5\' 7"  (1.702 m)   Wt 204 lb 1.9 oz (92.6 kg)   SpO2 96%   BMI 31.97 kg/m     Wt Readings from Last 3  Encounters:  04/09/19 204 lb 1.9 oz (92.6 kg)  03/22/19 205 lb (93 kg)  03/13/19 205 lb 1.9 oz (93 kg)     GEN:  Well nourished, well developed in no acute distress HEENT: Normal NECK: No JVD; No carotid bruits LYMPHATICS: No lymphadenopathy CARDIAC: RRR, no murmurs, rubs, gallops RESPIRATORY:  Clear to auscultation without rales, wheezing or rhonchi  ABDOMEN: Soft, non-tender, non-distended MUSCULOSKELETAL:  No edema; No deformity  SKIN: Warm and dry NEUROLOGIC:  Alert and oriented x 3 PSYCHIATRIC:  Normal affect    Signed, Shirlee More, MD  04/09/2019 10:28 AM    [place]

## 2019-04-09 ENCOUNTER — Other Ambulatory Visit: Payer: Self-pay

## 2019-04-09 ENCOUNTER — Ambulatory Visit (INDEPENDENT_AMBULATORY_CARE_PROVIDER_SITE_OTHER): Payer: Medicare Other | Admitting: Cardiology

## 2019-04-09 VITALS — BP 116/78 | HR 69 | Temp 97.2°F | Ht 67.0 in | Wt 204.1 lb

## 2019-04-09 DIAGNOSIS — I350 Nonrheumatic aortic (valve) stenosis: Secondary | ICD-10-CM

## 2019-04-09 DIAGNOSIS — I10 Essential (primary) hypertension: Secondary | ICD-10-CM

## 2019-04-09 DIAGNOSIS — E782 Mixed hyperlipidemia: Secondary | ICD-10-CM | POA: Diagnosis not present

## 2019-04-09 DIAGNOSIS — I25708 Atherosclerosis of coronary artery bypass graft(s), unspecified, with other forms of angina pectoris: Secondary | ICD-10-CM

## 2019-04-09 MED ORDER — NITROGLYCERIN 0.4 MG SL SUBL: 0.4000 mg | SUBLINGUAL_TABLET | Freq: Every day | SUBLINGUAL | 5 refills | Status: DC | PRN

## 2019-04-09 NOTE — Patient Instructions (Signed)
Medication Instructions:  Your physician recommends that you continue on your current medications as directed. Please refer to the Current Medication list given to you today.  If you need a refill on your cardiac medications before your next appointment, please call your pharmacy.   Lab work: None If you have labs (blood work) drawn today and your tests are completely normal, you will receive your results only by: Marland Kitchen MyChart Message (if you have MyChart) OR . A paper copy in the mail If you have any lab test that is abnormal or we need to change your treatment, we will call you to review the results.  Testing/Procedures: NOne  Follow-Up: At [facility], you and your health needs are our priority.  As part of our continuing mission to provide you with exceptional heart care, we have created designated Provider Care Teams.  These Care Teams include your primary Cardiologist (physician) and Advanced Practice Providers (APPs -  Physician Assistants and Nurse Practitioners) who all work together to provide you with the care you need, when you need it. You will need a follow up appointment in 1 years.  Please call our office 2 months in advance to schedule this appointment.  You may see No primary care provider on file. or another member of our [employer] in [place]: Jenne Campus, MD . Jyl Heinz, MD  Any Other Special Instructions Will Be Listed Below (If Applicable).

## 2019-04-10 ENCOUNTER — Ambulatory Visit: Payer: Medicare Other | Admitting: Cardiology

## 2019-05-09 ENCOUNTER — Other Ambulatory Visit: Payer: Self-pay

## 2019-05-09 ENCOUNTER — Ambulatory Visit (INDEPENDENT_AMBULATORY_CARE_PROVIDER_SITE_OTHER): Payer: Medicare Other

## 2019-05-09 DIAGNOSIS — Z23 Encounter for immunization: Secondary | ICD-10-CM

## 2019-06-26 ENCOUNTER — Encounter: Payer: Self-pay | Admitting: Family Medicine

## 2019-06-27 MED ORDER — ATORVASTATIN CALCIUM 40 MG PO TABS: 40.0000 mg | ORAL_TABLET | Freq: Every day | ORAL | 1 refills | Status: DC

## 2019-07-13 ENCOUNTER — Other Ambulatory Visit: Payer: Self-pay

## 2019-07-13 ENCOUNTER — Ambulatory Visit (INDEPENDENT_AMBULATORY_CARE_PROVIDER_SITE_OTHER): Payer: Medicare Other | Admitting: Family Medicine

## 2019-07-13 ENCOUNTER — Encounter: Payer: Self-pay | Admitting: Family Medicine

## 2019-07-13 VITALS — BP 118/68 | HR 68 | Temp 96.7°F | Ht 67.0 in | Wt 202.4 lb

## 2019-07-13 DIAGNOSIS — M353 Polymyalgia rheumatica: Secondary | ICD-10-CM | POA: Diagnosis not present

## 2019-07-13 DIAGNOSIS — I10 Essential (primary) hypertension: Secondary | ICD-10-CM | POA: Diagnosis not present

## 2019-07-13 DIAGNOSIS — E1121 Type 2 diabetes mellitus with diabetic nephropathy: Secondary | ICD-10-CM

## 2019-07-13 DIAGNOSIS — E1122 Type 2 diabetes mellitus with diabetic chronic kidney disease: Secondary | ICD-10-CM

## 2019-07-13 DIAGNOSIS — N1831 Chronic kidney disease, stage 3a: Secondary | ICD-10-CM | POA: Diagnosis not present

## 2019-07-13 LAB — HEMOGLOBIN A1C: Hgb A1c MFr Bld: 7.7 % — ABNORMAL HIGH (ref 4.6–6.5)

## 2019-07-13 MED ORDER — PREDNISONE 5 MG PO TABS: 5.0000 mg | ORAL_TABLET | Freq: Every day | ORAL | 2 refills | Status: DC

## 2019-07-13 NOTE — Patient Instructions (Signed)
Give us 2-3 business days to get the results of your labs back.   Keep the diet clean and stay active.  Let us know if you need anything. 

## 2019-07-13 NOTE — Progress Notes (Signed)
Subjective:   Chief Complaint  Patient presents with  . Follow-up    Gary Flynn is a 78 y.o. male here for follow-up of diabetes.   Gary Flynn does not monitor his sugars at home.  Patient does not require insulin.   Medications include: metformin 1000 mg bid, glyburide 5 mg/d Exercise: golfing  Hypertension Patient presents for hypertension follow up. He does not monitor home blood pressures. He is compliant with medications. Patient has these side effects of medication: none He is adhering to a healthy diet overall. Exercise: golfing  Hx of PMR Patient has a history of polymyalgia rheumatica.  He is currently taking prednisone 5 mg daily.  It is working well for him and he denies any pain.  He understands the risks and benefits of this approach.  He is also taking Fosamax weekly for steroid-induced osteoporosis.   Past Medical History:  Diagnosis Date  . Anxiety   . Arthritis    hands, lower back  . Benign prostatic hyperplasia with urinary frequency 03/08/2018  . Cataract    surgery to remove - bilateral  . Colon polyps   . Controlled type 2 diabetes mellitus without complication, without long-term current use of insulin ([place]) 01/11/2018  . Coronary artery disease involving coronary bypass graft 02/17/2018  . Diabetes mellitus without complication ([place])   . Heart disease   . Heart murmur    no problems  . History of MI (myocardial infarction)   . Hx of CABG 02/17/2018  . Hyperlipidemia   . Hypertension   . Long term (current) use of systemic steroids 01/11/2018  . Polymyalgia rheumatica ([place]) 01/11/2018  . Seborrheic keratoses 03/08/2018  . Stroke [facility])    no deficits - ? mini stroke per patient      Related testing: Date of retinal exam: Scheduled for early next month Pneumovax: done Flu Shot: done  Review of Systems: Pulmonary:  No SOB Cardiovascular:  No chest pain  Objective:  BP 118/68 (BP Location: Left Arm, Patient Position: Sitting, Cuff Size:  Normal)   Pulse 68   Temp (!) 96.7 F (35.9 C) (Temporal)   Ht 5\' 7"  (1.702 m)   Wt 202 lb 6 oz (91.8 kg)   SpO2 95%   BMI 31.70 kg/m  General:  Well developed, well nourished, in no apparent distress Skin:  Warm, no pallor or diaphoresis Head:  Normocephalic, atraumatic Eyes:  Pupils equal and round, sclera anicteric without injection  Lungs:  CTAB, no access msc use Cardio:  RRR, no bruits, no LE edema Musculoskeletal:  Symmetrical muscle groups noted without atrophy or deformity Neuro:  Sensation intact to pinprick on feet Psych: Age appropriate judgment and insight  Assessment:   Type 2 diabetes mellitus with stage 3a chronic kidney disease, without long-term current use of insulin (HCC) - Plan: Hemoglobin A1c, HM DIABETES FOOT EXAM  Polymyalgia rheumatica (HCC) - Plan: predniSONE (DELTASONE) 5 MG tablet  Essential hypertension   Plan:   Orders as above. Counseled on diet and exercise. F/u in 6 mo. The patient voiced understanding and agreement to the plan.  [place], DO 07/13/19 11:56 AM

## 2019-08-09 DIAGNOSIS — H35372 Puckering of macula, left eye: Secondary | ICD-10-CM | POA: Diagnosis not present

## 2019-08-09 DIAGNOSIS — H353131 Nonexudative age-related macular degeneration, bilateral, early dry stage: Secondary | ICD-10-CM | POA: Diagnosis not present

## 2019-08-09 DIAGNOSIS — E103292 Type 1 diabetes mellitus with mild nonproliferative diabetic retinopathy without macular edema, left eye: Secondary | ICD-10-CM | POA: Diagnosis not present

## 2019-08-09 DIAGNOSIS — H26493 Other secondary cataract, bilateral: Secondary | ICD-10-CM | POA: Diagnosis not present

## 2019-08-09 DIAGNOSIS — Z961 Presence of intraocular lens: Secondary | ICD-10-CM | POA: Diagnosis not present

## 2019-08-15 ENCOUNTER — Other Ambulatory Visit: Payer: Self-pay

## 2019-08-29 ENCOUNTER — Telehealth: Payer: Self-pay | Admitting: Family Medicine

## 2019-08-29 NOTE — Telephone Encounter (Signed)
Spoke with Gary Flynn regarding AWV. Patient stated he will give office a call back when he is ready to schedule.

## 2019-09-03 ENCOUNTER — Encounter: Payer: Self-pay | Admitting: Family Medicine

## 2019-09-03 MED ORDER — ALPRAZOLAM 0.25 MG PO TABS: 0.2500 mg | ORAL_TABLET | Freq: Two times a day (BID) | ORAL | 5 refills | Status: DC | PRN

## 2019-09-13 ENCOUNTER — Encounter: Payer: Self-pay | Admitting: Family Medicine

## 2019-09-18 ENCOUNTER — Other Ambulatory Visit: Payer: Self-pay

## 2019-09-19 ENCOUNTER — Encounter: Payer: Self-pay | Admitting: Family Medicine

## 2019-09-19 ENCOUNTER — Other Ambulatory Visit: Payer: Self-pay

## 2019-09-19 ENCOUNTER — Ambulatory Visit (INDEPENDENT_AMBULATORY_CARE_PROVIDER_SITE_OTHER): Payer: Medicare Other | Admitting: Family Medicine

## 2019-09-19 VITALS — BP 108/62 | HR 74 | Temp 96.7°F | Ht 67.0 in | Wt 198.1 lb

## 2019-09-19 DIAGNOSIS — T280XXA Burn of mouth and pharynx, initial encounter: Secondary | ICD-10-CM | POA: Diagnosis not present

## 2019-09-19 MED ORDER — LIDOCAINE VISCOUS HCL 2 % MT SOLN: 5.0000 mL | OROMUCOSAL | 0 refills | Status: AC | PRN

## 2019-09-19 NOTE — Patient Instructions (Addendum)
This looks like a burn to me.  The oral swish is to help with pain. This should go away in the next 2-4 weeks. If no, consider seeing your dentist or let me know.  Don't burn your mouth.  Let us know if you need anything.

## 2019-09-19 NOTE — Progress Notes (Signed)
Chief Complaint  Patient presents with  . tongue has a burned feeling    Gary Flynn is a 78 y.o. male here for a tongue complaint.  Duration: 4 weeks Location: tongue Pruritic? No Painful? Yes Drainage? No New soaps/lotions/topicals/detergents? No Other associated symptoms: may have burned in it on coffee Therapies tried thus far: none  ROS:  Const: No fevers Skin: As noted in HPI  Past Medical History:  Diagnosis Date  . Anxiety   . Arthritis    hands, lower back  . Benign prostatic hyperplasia with urinary frequency 03/08/2018  . Cataract    surgery to remove - bilateral  . Colon polyps   . Controlled type 2 diabetes mellitus without complication, without long-term current use of insulin ([place]) 01/11/2018  . Coronary artery disease involving coronary bypass graft 02/17/2018  . Diabetes mellitus without complication ([place])   . Heart disease   . Heart murmur    no problems  . History of MI (myocardial infarction)   . Hx of CABG 02/17/2018  . Hyperlipidemia   . Hypertension   . Long term (current) use of systemic steroids 01/11/2018  . Polymyalgia rheumatica ([place]) 01/11/2018  . Seborrheic keratoses 03/08/2018  . Stroke [facility])    no deficits - ? mini stroke per patient     BP 108/62 (BP Location: Left Arm, Patient Position: Sitting, Cuff Size: Normal)   Pulse 74   Temp (!) 96.7 F (35.9 C) (Temporal)   Ht 5\' 7"  (1.702 m)   Wt 198 lb 2 oz (89.9 kg)   SpO2 97%   BMI 31.03 kg/m  Gen: awake, alert, appearing stated age Lungs: No accessory muscle use HEENT: MMM, smooth strip of mucosa over R lateral portion of tongue. No erythema or drainage. No gingival abnormalities noted.  Psych: Age appropriate judgment and insight  Burn of tongue - Plan: lidocaine (XYLOCAINE) 2 % solution  I think he burned his tongue. I think he will improved steadily over next few weeks. Try not to injure/burn tongue moving forward. Instructed not to swallow lidocaine.  F/u prn. The patient  voiced understanding and agreement to the plan.  [place], DO 09/19/19 1:11 PM

## 2019-10-17 ENCOUNTER — Ambulatory Visit: Payer: Medicare Other

## 2019-10-26 ENCOUNTER — Ambulatory Visit: Payer: Medicare Other | Attending: Internal Medicine

## 2019-10-26 DIAGNOSIS — Z23 Encounter for immunization: Secondary | ICD-10-CM

## 2019-10-26 NOTE — Progress Notes (Signed)
   Covid-19 Vaccination Clinic  Name:  Montie Zajac    MRN: [id number] DOB: 1941/05/22  10/26/2019  Mr. Venables was observed post Covid-19 immunization for 15 minutes without incidence. He was provided with Vaccine Information Sheet and instruction to access the V-Safe system.   Mr. [place]uca was instructed to call 911 with any severe reactions post vaccine: Marland Kitchen Difficulty breathing  . Swelling of your face and throat  . A fast heartbeat  . A bad rash all over your body  . Dizziness and weakness    Immunizations Administered    Name Date Dose VIS Date Route   Pfizer COVID-19 Vaccine 10/26/2019  1:57 PM 0.3 mL 08/31/2019 Intramuscular   Manufacturer: [place]   Lot: [id number]   [place]: [id number]

## 2019-10-31 ENCOUNTER — Encounter: Payer: Self-pay | Admitting: Family Medicine

## 2019-11-05 ENCOUNTER — Other Ambulatory Visit: Payer: Self-pay

## 2019-11-06 ENCOUNTER — Encounter: Payer: Self-pay | Admitting: Family Medicine

## 2019-11-06 ENCOUNTER — Ambulatory Visit (INDEPENDENT_AMBULATORY_CARE_PROVIDER_SITE_OTHER): Payer: Medicare Other | Admitting: Family Medicine

## 2019-11-06 VITALS — BP 118/72 | HR 73 | Temp 95.9°F | Ht 67.0 in | Wt 200.0 lb

## 2019-11-06 DIAGNOSIS — I252 Old myocardial infarction: Secondary | ICD-10-CM | POA: Diagnosis not present

## 2019-11-06 DIAGNOSIS — R490 Dysphonia: Secondary | ICD-10-CM

## 2019-11-06 DIAGNOSIS — I10 Essential (primary) hypertension: Secondary | ICD-10-CM

## 2019-11-06 DIAGNOSIS — E119 Type 2 diabetes mellitus without complications: Secondary | ICD-10-CM | POA: Diagnosis not present

## 2019-11-06 MED ORDER — LEVOCETIRIZINE DIHYDROCHLORIDE 5 MG PO TABS: 5.0000 mg | ORAL_TABLET | Freq: Every evening | ORAL | 2 refills | Status: DC

## 2019-11-06 MED ORDER — METFORMIN HCL 500 MG PO TABS: 1000.0000 mg | ORAL_TABLET | Freq: Two times a day (BID) | ORAL | 3 refills | Status: DC

## 2019-11-06 MED ORDER — CARVEDILOL 3.125 MG PO TABS: 3.1250 mg | ORAL_TABLET | Freq: Two times a day (BID) | ORAL | 3 refills | Status: DC

## 2019-11-06 NOTE — Patient Instructions (Signed)
I think this is related to drainage and possibly allergies. Let's see how this does over the next 2-3 weeks. If no better, let me know and we will set you up with an ENT provider.  Let us know if you need anything.

## 2019-11-06 NOTE — Progress Notes (Signed)
Chief Complaint  Patient presents with  . Follow-up    check throat/swollen    Subjective: Patient is a 79 y.o. male here for throat issue.  3-4 mo of hoarseness and scratchiness in throat. Feels tighter in the neck area. Sometimes will cough. Nothing makes it bette or worse. +runny nose, no congestion. No globus sensation. Denies pain or fevers.   ROS: Lungs: Denies wheezing   Past Medical History:  Diagnosis Date  . Anxiety   . Arthritis    hands, lower back  . Benign prostatic hyperplasia with urinary frequency 03/08/2018  . Cataract    surgery to remove - bilateral  . Colon polyps   . Controlled type 2 diabetes mellitus without complication, without long-term current use of insulin ([place]) 01/11/2018  . Coronary artery disease involving coronary bypass graft 02/17/2018  . Diabetes mellitus without complication ([place])   . Heart disease   . Heart murmur    no problems  . History of MI (myocardial infarction)   . Hx of CABG 02/17/2018  . Hyperlipidemia   . Hypertension   . Long term (current) use of systemic steroids 01/11/2018  . Polymyalgia rheumatica ([place]) 01/11/2018  . Seborrheic keratoses 03/08/2018    Objective: BP 118/72 (BP Location: Left Arm, Patient Position: Sitting, Cuff Size: Normal)   Pulse 73   Temp (!) 95.9 F (35.5 C) (Temporal)   Ht 5\' 7"  (1.702 m)   Wt 90.7 kg   SpO2 94%   BMI 31.32 kg/m  General: Awake, appears stated age HEENT: MMM, EOMi, MMM, +PND, nares patent w/o dc or edema of turbinates b/l, ears neg Heart: RRR Lungs: CTAB, no rales, wheezes or rhonchi. No accessory muscle use Psych: Age appropriate judgment and insight, normal affect and mood  Assessment and Plan: Hoarseness - Plan: levocetirizine (XYZAL) 5 MG tablet  Controlled type 2 diabetes mellitus without complication, without long-term current use of insulin (HCC) - Plan: metFORMIN (GLUCOPHAGE) 500 MG tablet  Essential hypertension - Plan: carvedilol (COREG) 3.125 MG  tablet  History of MI (myocardial infarction) - Plan: carvedilol (COREG) 3.125 MG tablet  Start Xyzal. If no improvement in 2 weeks, will have him send message and will refer to ENT. F/u as originally scheduled otherwise. The patient voiced understanding and agreement to the plan.  [place], DO 11/06/19  10:22 AM

## 2019-11-12 ENCOUNTER — Encounter: Payer: Self-pay | Admitting: Family Medicine

## 2019-11-12 MED ORDER — ALENDRONATE SODIUM 70 MG PO TABS: 70.0000 mg | ORAL_TABLET | ORAL | 3 refills | Status: DC

## 2019-11-13 ENCOUNTER — Encounter: Payer: Self-pay | Admitting: Family Medicine

## 2019-11-20 ENCOUNTER — Ambulatory Visit: Payer: Medicare Other | Attending: Internal Medicine

## 2019-11-20 DIAGNOSIS — Z23 Encounter for immunization: Secondary | ICD-10-CM

## 2019-11-20 NOTE — Progress Notes (Signed)
   Covid-19 Vaccination Clinic  Name:  Jamorie Noguez    MRN: [id number] DOB: 20-Oct-1940  11/20/2019  Mr. Hanney was observed post Covid-19 immunization for 15 minutes without incident. He was provided with Vaccine Information Sheet and instruction to access the V-Safe system.   Mr. Sarti was instructed to call 911 with any severe reactions post vaccine: Marland Kitchen Difficulty breathing  . Swelling of face and throat  . A fast heartbeat  . A bad rash all over body  . Dizziness and weakness   Immunizations Administered    Name Date Dose VIS Date Route   Pfizer COVID-19 Vaccine 11/20/2019  1:37 PM 0.3 mL 08/31/2019 Intramuscular   Manufacturer: [place]   Lot: [id number]   [place]: [id number]

## 2020-01-01 MED ORDER — ATORVASTATIN CALCIUM 40 MG PO TABS: 40.0000 mg | ORAL_TABLET | Freq: Every day | ORAL | 0 refills | Status: DC

## 2020-01-11 ENCOUNTER — Ambulatory Visit: Payer: Medicare Other | Admitting: Family Medicine

## 2020-01-31 ENCOUNTER — Other Ambulatory Visit: Payer: Self-pay | Admitting: Family Medicine

## 2020-01-31 DIAGNOSIS — M353 Polymyalgia rheumatica: Secondary | ICD-10-CM

## 2020-01-31 DIAGNOSIS — I10 Essential (primary) hypertension: Secondary | ICD-10-CM

## 2020-01-31 MED ORDER — LISINOPRIL-HYDROCHLOROTHIAZIDE 20-12.5 MG PO TABS: 1.0000 | ORAL_TABLET | Freq: Every day | ORAL | 3 refills | Status: DC

## 2020-01-31 MED ORDER — PREDNISONE 5 MG PO TABS: 5.0000 mg | ORAL_TABLET | Freq: Every day | ORAL | 2 refills | Status: DC

## 2020-01-31 NOTE — Telephone Encounter (Signed)
Medication sent.

## 2020-01-31 NOTE — Telephone Encounter (Signed)
Medication: predniSONE (DELTASONE) 5 MG tablet [id number]  lisinopril-hydrochlorothiazide (ZESTORETIC) 20-12.5 MG tablet [id number]      Has the patient contacted their pharmacy? No. (If no, request that the patient contact the pharmacy for the refill.) (If yes, when and what did the pharmacy advise?)  Preferred Pharmacy (with phone number or street name): [facility]  [place], [place]  [address]  Phone:  [phone] Fax:  [phone]  DEA #:  --  Agent: Please be advised that RX refills may take up to 3 business days. We ask that you follow-up with your pharmacy.

## 2020-02-04 DIAGNOSIS — R490 Dysphonia: Secondary | ICD-10-CM

## 2020-02-04 DIAGNOSIS — I252 Old myocardial infarction: Secondary | ICD-10-CM

## 2020-02-04 DIAGNOSIS — I10 Essential (primary) hypertension: Secondary | ICD-10-CM

## 2020-02-04 MED ORDER — LEVOCETIRIZINE DIHYDROCHLORIDE 5 MG PO TABS: 5.0000 mg | ORAL_TABLET | Freq: Every evening | ORAL | 2 refills | Status: DC

## 2020-02-04 MED ORDER — CARVEDILOL 3.125 MG PO TABS: 3.1250 mg | ORAL_TABLET | Freq: Two times a day (BID) | ORAL | 3 refills | Status: DC

## 2020-02-13 ENCOUNTER — Ambulatory Visit: Payer: Medicare Other | Admitting: Family Medicine

## 2020-02-27 ENCOUNTER — Ambulatory Visit (INDEPENDENT_AMBULATORY_CARE_PROVIDER_SITE_OTHER): Payer: Medicare Other | Admitting: Family Medicine

## 2020-02-27 ENCOUNTER — Encounter: Payer: Self-pay | Admitting: Family Medicine

## 2020-02-27 ENCOUNTER — Other Ambulatory Visit: Payer: Self-pay

## 2020-02-27 VITALS — BP 118/76 | HR 78 | Temp 95.6°F | Ht 67.0 in | Wt 204.4 lb

## 2020-02-27 DIAGNOSIS — E1169 Type 2 diabetes mellitus with other specified complication: Secondary | ICD-10-CM | POA: Diagnosis not present

## 2020-02-27 DIAGNOSIS — E669 Obesity, unspecified: Secondary | ICD-10-CM | POA: Diagnosis not present

## 2020-02-27 DIAGNOSIS — I10 Essential (primary) hypertension: Secondary | ICD-10-CM | POA: Diagnosis not present

## 2020-02-27 DIAGNOSIS — M353 Polymyalgia rheumatica: Secondary | ICD-10-CM

## 2020-02-27 DIAGNOSIS — E119 Type 2 diabetes mellitus without complications: Secondary | ICD-10-CM | POA: Insufficient documentation

## 2020-02-27 DIAGNOSIS — Z7952 Long term (current) use of systemic steroids: Secondary | ICD-10-CM

## 2020-02-27 LAB — COMPREHENSIVE METABOLIC PANEL
ALT: 17 U/L (ref 0–53)
AST: 13 U/L (ref 0–37)
Albumin: 4.2 g/dL (ref 3.5–5.2)
Alkaline Phosphatase: 41 U/L (ref 39–117)
BUN: 24 mg/dL — ABNORMAL HIGH (ref 6–23)
CO2: 33 mEq/L — ABNORMAL HIGH (ref 19–32)
Calcium: 9.5 mg/dL (ref 8.4–10.5)
Chloride: 100 mEq/L (ref 96–112)
Creatinine, Ser: 1.29 mg/dL (ref 0.40–1.50)
GFR: 53.84 mL/min — ABNORMAL LOW (ref >60.00)
Glucose, Bld: 149 mg/dL — ABNORMAL HIGH (ref 70–99)
Potassium: 5 mEq/L (ref 3.5–5.1)
Sodium: 138 mEq/L (ref 135–145)
Total Bilirubin: 0.7 mg/dL (ref 0.2–1.2)
Total Protein: 6.4 g/dL (ref 6.0–8.3)

## 2020-02-27 LAB — LIPID PANEL
Cholesterol: 141 mg/dL (ref 0–200)
HDL: 47.3 mg/dL (ref >39.00)
LDL Cholesterol: 73 mg/dL (ref 0–99)
NonHDL: 93.55
Total CHOL/HDL Ratio: 3
Triglycerides: 102 mg/dL (ref 0.0–149.0)
VLDL: 20.4 mg/dL (ref 0.0–40.0)

## 2020-02-27 LAB — MICROALBUMIN / CREATININE URINE RATIO
Creatinine,U: 93.2 mg/dL
Microalb Creat Ratio: 0.8 mg/g (ref 0.0–30.0)
Microalb, Ur: <0.7 mg/dL (ref 0.0–1.9)

## 2020-02-27 LAB — HEMOGLOBIN A1C: Hgb A1c MFr Bld: 7.7 % — ABNORMAL HIGH (ref 4.6–6.5)

## 2020-02-27 MED ORDER — AMLODIPINE BESYLATE 5 MG PO TABS: 5.0000 mg | ORAL_TABLET | Freq: Every day | ORAL | 3 refills | Status: DC

## 2020-02-27 NOTE — Progress Notes (Signed)
Subjective:   Chief Complaint  Patient presents with  . Follow-up    Gary Flynn is a 79 y.o. male here for follow-up of diabetes.   Gary Flynn does not check his sugars.  Patient does not require insulin.   Medications include: glyburide 5 mg/d Exercise: golfing Diet is fair.   Hypertension Patient presents for hypertension follow up. He does not monitor home blood pressures. He is compliant with medications-Norvasc 5 mg daily, Coreg 3.125 mg twice daily, Zestoretic 20-12.5 mg daily. Patient has these side effects of medication: none Diet and exercise as above  Polymyalgia rheumatica patient has a history of this requiring 5 mg of prednisone daily.  He has tried other disease modifying drugs that he does not wish to take.  We did have a shared decision-making discussion in the past regarding risks and benefits of long-term steroid use which she agreed to take.  He is taking calcium and vitamin D.  He is exercising relatively routinely.  Past Medical History:  Diagnosis Date  . Anxiety   . Arthritis    hands, lower back  . Benign prostatic hyperplasia with urinary frequency 03/08/2018  . Cataract    surgery to remove - bilateral  . Colon polyps   . Controlled type 2 diabetes mellitus without complication, without long-term current use of insulin (Gary Flynn) 01/11/2018  . Coronary artery disease involving coronary bypass graft 02/17/2018  . Diabetes mellitus without complication (Gary Flynn)   . Heart disease   . Heart murmur    no problems  . History of MI (myocardial infarction)   . Hx of CABG 02/17/2018  . Hyperlipidemia   . Hypertension   . Long term (current) use of systemic steroids 01/11/2018  . Polymyalgia rheumatica (Gary Flynn) 01/11/2018  . Seborrheic keratoses 03/08/2018     Related testing: Date of retinal exam: Done Pneumovax: done  Objective:  BP 118/76 (BP Location: Left Arm, Patient Position: Sitting, Cuff Size: Normal)   Pulse 78   Temp (!) 95.6 F (35.3 C) (Temporal)    Ht 5\' 7"  (1.702 m)   Wt 204 lb 6 oz (92.7 kg)   SpO2 95%   BMI 32.01 kg/m  General:  Well developed, well nourished, in no apparent distress Lungs:  CTAB, no access msc use Cardio:  RRR, no bruits, no LE edema Psych: Age appropriate judgment and insight  Assessment:   Diabetes mellitus type 2 in obese (Gary Flynn) - Plan: Comprehensive metabolic panel, Lipid panel, Hemoglobin A1c, Microalbumin / creatinine urine ratio  Essential hypertension - Plan: amLODipine (NORVASC) 5 MG tablet  Polymyalgia rheumatica (HCC)  Long term (current) use of systemic steroids - Plan: DG Bone Density   Plan:   1- Cont care. Counseled on diet and exercise. 2- Cont care. 3- Cont care. Ck DEXA. F/u in 6 mo. The patient voiced understanding and agreement to the plan.  [place], DO 02/27/20 11:56 AM

## 2020-02-27 NOTE — Patient Instructions (Signed)
Give us 2-3 business days to get the results of your labs back.   Keep the diet clean and stay active.  Let us know if you need anything. 

## 2020-03-04 ENCOUNTER — Other Ambulatory Visit: Payer: Self-pay

## 2020-03-04 ENCOUNTER — Ambulatory Visit (HOSPITAL_BASED_OUTPATIENT_CLINIC_OR_DEPARTMENT_OTHER)
Admission: RE | Admit: 2020-03-04 | Discharge: 2020-03-04 | Disposition: A | Discharge status: Home / Self Care | Payer: Medicare Other | Source: Ambulatory Visit | Attending: Family Medicine | Admitting: Family Medicine

## 2020-03-04 DIAGNOSIS — M85852 Other specified disorders of bone density and structure, left thigh: Secondary | ICD-10-CM | POA: Insufficient documentation

## 2020-03-04 DIAGNOSIS — Z7952 Long term (current) use of systemic steroids: Secondary | ICD-10-CM

## 2020-03-24 DIAGNOSIS — E119 Type 2 diabetes mellitus without complications: Secondary | ICD-10-CM

## 2020-03-25 MED ORDER — GLYBURIDE 5 MG PO TABS: 5.0000 mg | ORAL_TABLET | Freq: Every day | ORAL | 3 refills | Status: AC

## 2020-03-25 MED ORDER — ATORVASTATIN CALCIUM 40 MG PO TABS: 40.0000 mg | ORAL_TABLET | Freq: Every day | ORAL | 3 refills | Status: DC

## 2020-04-02 ENCOUNTER — Other Ambulatory Visit: Payer: Self-pay | Admitting: Family Medicine

## 2020-04-02 MED ORDER — ALPRAZOLAM 0.25 MG PO TABS: 0.2500 mg | ORAL_TABLET | Freq: Two times a day (BID) | ORAL | 5 refills | Status: DC | PRN

## 2020-04-08 ENCOUNTER — Encounter: Payer: Self-pay | Admitting: Family Medicine

## 2020-04-08 ENCOUNTER — Ambulatory Visit (INDEPENDENT_AMBULATORY_CARE_PROVIDER_SITE_OTHER): Payer: Medicare Other | Admitting: Family Medicine

## 2020-04-08 ENCOUNTER — Other Ambulatory Visit: Payer: Self-pay

## 2020-04-08 VITALS — BP 120/78 | HR 86 | Temp 97.8°F | Ht 67.0 in | Wt 205.4 lb

## 2020-04-08 DIAGNOSIS — M722 Plantar fascial fibromatosis: Secondary | ICD-10-CM

## 2020-04-08 MED ORDER — PREDNISONE 50 MG PO TABS: ORAL_TABLET | ORAL | 0 refills | Status: DC

## 2020-04-08 NOTE — Progress Notes (Signed)
Musculoskeletal Exam  Patient: Gary Flynn DOB: 11/02/1940  DOS: 04/08/2020  SUBJECTIVE:  Chief Complaint:   Chief Complaint  Patient presents with  . Foot Pain    right    Kasem Mozer is a 79 y.o.  male for evaluation and treatment of R foot pain.   Onset:  5 days ago. No inj or change in activity.  Location: R heel Character:  sharp  Progression of issue:  is unchanged Associated symptoms: no swelling, redness, bruising Treatment: to date has been oral steroids, Tylenol.  Neurovascular symptoms: no  Past Medical History:  Diagnosis Date  . Anxiety   . Arthritis    hands, lower back  . Benign prostatic hyperplasia with urinary frequency 03/08/2018  . Cataract    surgery to remove - bilateral  . Colon polyps   . Controlled type 2 diabetes mellitus without complication, without long-term current use of insulin ([place]) 01/11/2018  . Coronary artery disease involving coronary bypass graft 02/17/2018  . Diabetes mellitus without complication ([place])   . Heart disease   . Heart murmur    no problems  . History of MI (myocardial infarction)   . Hx of CABG 02/17/2018  . Hyperlipidemia   . Hypertension   . Long term (current) use of systemic steroids 01/11/2018  . Polymyalgia rheumatica ([place]) 01/11/2018  . Seborrheic keratoses 03/08/2018    Objective: VITAL SIGNS: BP 120/78 (BP Location: Left Arm, Patient Position: Sitting, Cuff Size: Normal)   Pulse 86   Temp 97.8 F (36.6 C) (Oral)   Ht 5\' 7"  (1.702 m)   Wt 205 lb 6 oz (93.2 kg)   SpO2 96%   BMI 32.17 kg/m  Constitutional: Well formed, well developed. No acute distress. Thorax & Lungs: No accessory muscle use Musculoskeletal: R foot.   Tenderness to palpation: yes over prox insertion of PF Deformity: no Ecchymosis: no Neurologic: Normal sensory function. Antalgic gait.  Psychiatric: Normal mood. Age appropriate judgment and insight. Alert & oriented x 3.    Assessment:  Plantar fasciitis, right - Plan:  predniSONE (DELTASONE) 50 MG tablet  Plan: Stretches/exercises, [place] sock, ice, Tylenol. Pred burst. Injection if no better vs PT.  F/u prn. The patient voiced understanding and agreement to the plan.   [place], DO 04/08/20  9:34 AM

## 2020-04-08 NOTE — Patient Instructions (Signed)
Ice/cold pack over area for 10-15 min twice daily. Consider every hour if helpful.  OK to take Tylenol 1000 mg (2 extra strength tabs) or 975 mg (3 regular strength tabs) every 6 hours as needed.  Don't take your usual prednisone for the next 5 days while on this higher dosage.   Consider a [place] sock to wear at night.  Plantar Fasciitis Stretches/exercises Do exercises exactly as told by your health care provider and adjust them as directed. It is normal to feel mild stretching, pulling, tightness, or discomfort as you do these exercises, but you should stop right away if you feel sudden pain or your pain gets worse.   Stretching and range of motion exercises These exercises warm up your muscles and joints and improve the movement and flexibility of your foot. These exercises also help to relieve pain.  Exercise A: Plantar fascia stretch 1. Sit with your left / right leg crossed over your opposite knee. 2. Hold your heel with one hand with that thumb near your arch. With your other hand, hold your toes and gently pull them back toward the top of your foot. You should feel a stretch on the bottom of your toes or your foot or both. 3. Hold this stretch for 30 seconds. 4. Slowly release your toes and return to the starting position. Repeat 2 times. Complete this exercise 3 times per week.  Exercise B: Gastroc, standing 1. Stand with your hands against a wall. 2. Extend your left / right leg behind you, and bend your front knee slightly. 3. Keeping your heels on the floor and keeping your back knee straight, shift your weight toward the wall without arching your back. You should feel a gentle stretch in your left / right calf. 4. Hold this position for 30 seconds. Repeat 2 times. Complete this exercise 3 times a week. Exercise C: Soleus, standing 1. Stand with your hands against a wall. 2. Extend your left / right leg behind you, and bend your front knee slightly. 3. Keeping your  heels on the floor, bend your back knee and slightly shift your weight over the back leg. You should feel a gentle stretch deep in your calf. 4. Hold this position for 30 seconds. Repeat 2 times. Complete this exercise 3 times per week. Exercise D: Gastrocsoleus, standing 1. Stand with the ball of your left / right foot on a step. The ball of your foot is on the walking surface, right under your toes. 2. Keep your other foot firmly on the same step. 3. Hold onto the wall or a railing for balance. 4. Slowly lift your other foot, allowing your body weight to press your heel down over the edge of the step. You should feel a stretch in your left / right calf. 5. Hold this position for 30 seconds. 6. Return both feet to the step. 7. Repeat this exercise with a slight bend in your left / right knee. Repeat 2 times with your left / right knee straight and 2times with your left / right knee bent. Complete this exercise 3 times a week.  Balance exercise This exercise builds your balance and strength control of your arch to help take pressure off your plantar fascia. Exercise E: Single leg stand 1. Without shoes, stand near a railing or in a doorway. You may hold onto the railing or door frame as needed. 2. Stand on your left / right foot. Keep your big toe down on the floor and try  to keep your arch lifted. Do not let your foot roll inward. 3. Hold this position for 30 seconds. 4. If this exercise is too easy, you can try it with your eyes closed or while standing on a pillow. Repeat 2 times. Complete this exercise 3 times per week. This information is not intended to replace advice given to you by your health care provider. Make sure you discuss any questions you have with your health care provider. Document Released: 09/06/2005 Document Revised: 05/11/2016 Document Reviewed: 07/21/2015 Elsevier Interactive Patient Education  2017 [employer].

## 2020-05-09 NOTE — Telephone Encounter (Signed)
Patient wife called stating he is a great amount of pain and it is not like him to complain but he is unable to move for the pain in his back. I did look for an appointment with another provider however no one has availability. She states his pain got better short term when you increased his prednisone however it is back significantly. He does not want to go through the weekend in this amount of pain. I did explain you were out of the office and may not be able to respond to this message before Monday but I promised I would send it to you. The patient and wife declined sending this message to another provider to handle as he states he only wants to hear from you. I advised them to go to an Urgent care to be seen over the weekend or if pain worsens go to the nearest ER. She verbalized understanding and states she will hopefully look for a message or call from  You.

## 2020-05-14 ENCOUNTER — Ambulatory Visit (INDEPENDENT_AMBULATORY_CARE_PROVIDER_SITE_OTHER): Payer: Medicare Other | Admitting: Family Medicine

## 2020-05-14 ENCOUNTER — Other Ambulatory Visit: Payer: Self-pay

## 2020-05-14 ENCOUNTER — Encounter: Payer: Self-pay | Admitting: Family Medicine

## 2020-05-14 VITALS — BP 108/68 | HR 102 | Temp 98.0°F | Ht 67.0 in | Wt 206.0 lb

## 2020-05-14 DIAGNOSIS — M353 Polymyalgia rheumatica: Secondary | ICD-10-CM

## 2020-05-14 MED ORDER — NITROGLYCERIN 0.4 MG SL SUBL: 0.4000 mg | SUBLINGUAL_TABLET | Freq: Every day | SUBLINGUAL | 5 refills | Status: AC | PRN

## 2020-05-14 MED ORDER — PREDNISONE 10 MG PO TABS: 10.0000 mg | ORAL_TABLET | Freq: Every day | ORAL | 2 refills | Status: AC

## 2020-05-14 NOTE — Patient Instructions (Signed)
Consider taking 10 mg one day and 5 mg alternating moving forward.   Keep up the good work.  Good luck with your house hunting.  Let us know if you need anything.

## 2020-05-14 NOTE — Progress Notes (Signed)
Chief Complaint  Patient presents with  . Follow-up    Subjective: Patient is a 79 y.o. male here for medication review.   Patient has a history of polymyalgia rheumatica.  His symptoms have been flaring over the past several weeks.  We had increased him to 10 mg daily from 5 mg daily of prednisone.  He feels his symptoms are now resolved.  He was placed on gabapentin in the past but it did not help.  He saw a rheumatology who stated they did not have anything to add.  His last bone density scan was in June of this year and showed osteopenia, stable from previous testing.  No swelling, fluid retention elsewhere, increased appetite, or other adverse effects noted.  Past Medical History:  Diagnosis Date  . Anxiety   . Arthritis    hands, lower back  . Benign prostatic hyperplasia with urinary frequency 03/08/2018  . Cataract    surgery to remove - bilateral  . Colon polyps   . Controlled type 2 diabetes mellitus without complication, without long-term current use of insulin ([place]) 01/11/2018  . Coronary artery disease involving coronary bypass graft 02/17/2018  . Diabetes mellitus without complication ([place])   . Heart disease   . Heart murmur    no problems  . History of MI (myocardial infarction)   . Hx of CABG 02/17/2018  . Hyperlipidemia   . Hypertension   . Long term (current) use of systemic steroids 01/11/2018  . Polymyalgia rheumatica ([place]) 01/11/2018  . Seborrheic keratoses 03/08/2018    Objective: BP 108/68 (BP Location: Left Arm, Patient Position: Sitting, Cuff Size: Normal)   Pulse (!) 102   Temp 98 F (36.7 C) (Oral)   Ht 5\' 7"  (1.702 m)   Wt 206 lb (93.4 kg)   SpO2 97%   BMI 32.26 kg/m  General: Awake, appears stated age Heart: RRR, no lower extremity edema Lungs: CTAB, no rales, wheezes or rhonchi. No accessory muscle use Psych: Age appropriate judgment and insight, normal affect and mood  Assessment and Plan: Polymyalgia rheumatica ([place]) - Plan: predniSONE  (DELTASONE) 10 MG tablet  Continue shared decision-making regarding prednisone for PMR.  We will continue to monitor his sugars and bone density while assessing for other adverse effects that may arise. Follow-up as originally scheduled. The patient voiced understanding and agreement to the plan.  [place], DO 05/14/20  3:57 PM

## 2020-06-26 DIAGNOSIS — E119 Type 2 diabetes mellitus without complications: Secondary | ICD-10-CM | POA: Diagnosis not present

## 2020-06-26 DIAGNOSIS — H353131 Nonexudative age-related macular degeneration, bilateral, early dry stage: Secondary | ICD-10-CM | POA: Diagnosis not present

## 2020-08-12 DIAGNOSIS — R072 Precordial pain: Secondary | ICD-10-CM | POA: Diagnosis not present

## 2020-08-26 ENCOUNTER — Ambulatory Visit: Payer: Medicare Other | Admitting: Family Medicine

## 2020-09-15 ENCOUNTER — Telehealth (INDEPENDENT_AMBULATORY_CARE_PROVIDER_SITE_OTHER): Payer: Medicare Other | Admitting: Family Medicine

## 2020-09-15 ENCOUNTER — Encounter: Payer: Self-pay | Admitting: Family Medicine

## 2020-09-15 ENCOUNTER — Other Ambulatory Visit: Payer: Self-pay

## 2020-09-15 DIAGNOSIS — I10 Essential (primary) hypertension: Secondary | ICD-10-CM

## 2020-09-15 DIAGNOSIS — E1169 Type 2 diabetes mellitus with other specified complication: Secondary | ICD-10-CM

## 2020-09-15 DIAGNOSIS — E669 Obesity, unspecified: Secondary | ICD-10-CM | POA: Diagnosis not present

## 2020-09-15 MED ORDER — METFORMIN HCL 500 MG PO TABS: 1000.0000 mg | ORAL_TABLET | Freq: Two times a day (BID) | ORAL | 3 refills | Status: DC

## 2020-09-15 MED ORDER — ALENDRONATE SODIUM 70 MG PO TABS
70.0000 mg | ORAL_TABLET | ORAL | 3 refills | Status: DC
Start: 2020-09-15 — End: 2020-11-25

## 2020-09-15 MED ORDER — AMLODIPINE BESYLATE 5 MG PO TABS: 5.0000 mg | ORAL_TABLET | Freq: Every day | ORAL | 3 refills | Status: AC

## 2020-09-15 NOTE — Progress Notes (Signed)
Subjective:   Chief Complaint  Patient presents with  . Follow-up    Gary Flynn is a 79 y.o. male here for follow-up of diabetes.  Due to COVID-19 pandemic, we are interacting via web portal for an electronic face-to-face visit. I verified patient's ID using 2 identifiers. Patient agreed to proceed with visit via this method. Patient is at [facility] where is wife is admitted and I am at office. Patient, his wife and I are present for visit.   Gary Flynn's self monitored glucose range is mid 100's.  Patient denies hypoglycemic reactions. He checks his glucose levels intermittently  Patient does not require insulin.   Medications include: glyburide 5 mg/d, metformin 1000 mg bid Diet is fair.  Exercise: walking  Hypertension Patient presents for hypertension follow up. He does not routinely monitor home blood pressures. He is compliant with medications- Zestoretic 20-12.5 mg/d, amlodipine 5 mg/d, Coreg 3.125 mg bid. Patient has these side effects of medication: none Diet/exercise as above.   Past Medical History:  Diagnosis Date  . Anxiety   . Arthritis    hands, lower back  . Benign prostatic hyperplasia with urinary frequency 03/08/2018  . Cataract    surgery to remove - bilateral  . Colon polyps   . Controlled type 2 diabetes mellitus without complication, without long-term current use of insulin ([place]) 01/11/2018  . Coronary artery disease involving coronary bypass graft 02/17/2018  . Diabetes mellitus without complication ([place])   . Heart disease   . Heart murmur    no problems  . History of MI (myocardial infarction)   . Hx of CABG 02/17/2018  . Hyperlipidemia   . Hypertension   . Long term (current) use of systemic steroids 01/11/2018  . Polymyalgia rheumatica ([place]) 01/11/2018  . Seborrheic keratoses 03/08/2018     Related testing: Retinal exam: Done Pneumovax: done  Objective:  No conversational dyspnea Age appropriate judgment and insight Nml affect and  mood  Assessment:   Diabetes mellitus type 2 in obese ([place]) - Plan: metFORMIN (GLUCOPHAGE) 500 MG tablet, Hemoglobin A1c, Comprehensive metabolic panel, Lipid panel  Essential hypertension - Plan: amLODipine (NORVASC) 5 MG tablet   Plan:   1. Would like to ck labs at some point. He has moved to [facility]. Looking for new PCP, but worrying about spouse at this time, understandably. I am happy to fill what he needs until he gets in. Counseled on diet and exercise. 2. BP controlled. Cont Zestoretic 20-12.5 mg bid, Coreg 3.125 mg bid and Norvasc 5 mg/d.  F/u in 6 mo. The patient voiced understanding and agreement to the plan.  [place], DO 09/15/20 10:12 AM

## 2020-10-01 ENCOUNTER — Other Ambulatory Visit: Payer: Self-pay

## 2020-10-01 ENCOUNTER — Encounter: Payer: Self-pay | Admitting: Family Medicine

## 2020-10-01 ENCOUNTER — Ambulatory Visit (INDEPENDENT_AMBULATORY_CARE_PROVIDER_SITE_OTHER): Payer: Medicare Other | Admitting: Family Medicine

## 2020-10-01 VITALS — BP 120/78 | HR 97 | Temp 97.6°F | Ht 67.0 in | Wt 194.1 lb

## 2020-10-01 DIAGNOSIS — R0600 Dyspnea, unspecified: Secondary | ICD-10-CM | POA: Diagnosis not present

## 2020-10-01 DIAGNOSIS — R0609 Other forms of dyspnea: Secondary | ICD-10-CM

## 2020-10-01 DIAGNOSIS — Z23 Encounter for immunization: Secondary | ICD-10-CM | POA: Diagnosis not present

## 2020-10-01 DIAGNOSIS — E1165 Type 2 diabetes mellitus with hyperglycemia: Secondary | ICD-10-CM

## 2020-10-01 DIAGNOSIS — Z1159 Encounter for screening for other viral diseases: Secondary | ICD-10-CM

## 2020-10-01 NOTE — Progress Notes (Addendum)
Subjective:   Chief Complaint  Patient presents with  . Follow-up    Gary Flynn is a 80 y.o. male here for follow-up of diabetes.   [place] does not monitor sugars at home.  Patient does not require insulin.   Medications include: glyburide 5 mg/d, metformin 1000 mg bid Diet is healthy overall.  Exercise: walking  DOE Still bothersome, started a couple years ago. Stress test/cardiac workup neg. Sometimes will cough and wheeze. Usually with exertion. Has not tried anything thus far.   Past Medical History:  Diagnosis Date  . Anxiety   . Arthritis    hands, lower back  . Benign prostatic hyperplasia with urinary frequency 03/08/2018  . Cataract    surgery to remove - bilateral  . Colon polyps   . Controlled type 2 diabetes mellitus without complication, without long-term current use of insulin ([place]) 01/11/2018  . Coronary artery disease involving coronary bypass graft 02/17/2018  . Diabetes mellitus without complication ([place])   . Heart disease   . Heart murmur    no problems  . History of MI (myocardial infarction)   . Hx of CABG 02/17/2018  . Hyperlipidemia   . Hypertension   . Long term (current) use of systemic steroids 01/11/2018  . Polymyalgia rheumatica ([place]) 01/11/2018  . Seborrheic keratoses 03/08/2018     Related testing: Retinal exam: Done Pneumovax: done  Objective:  BP 120/78 (BP Location: Left Arm, Patient Position: Sitting, Cuff Size: Normal)   Pulse 97   Temp 97.6 F (36.4 C) (Oral)   Ht 5\' 7"  (1.702 m)   Wt 194 lb 2 oz (88.1 kg)   SpO2 96%   BMI 30.40 kg/m  General:  Well developed, well nourished, in no apparent distress Eyes:  Pupils equal and round, sclera anicteric without injection  Lungs:  CTAB, no access msc use Cardio:  RRR, no bruits, no LE edema Psych: Age appropriate judgment and insight  Assessment:   Type 2 diabetes mellitus with hyperglycemia, without long-term current use of insulin ([facility]) - Plan: Hemoglobin A1c, Lipid panel,  Comprehensive metabolic panel  DOE (dyspnea on exertion) - Plan: CBC, Magnesium  Encounter for hepatitis C screening test for low risk patient - Plan: Hepatitis C antibody  Need for influenza vaccination - Plan: Flu Vaccine QUAD High Dose(Fluad)   Plan:   1. Cont glyburide 5 mg/d and metformin 1000 mg bid. Ck above. Counseled on diet and exercise. 2. Ck labs, if neg will trial SABA. Fu in 1 mo can be virtual. Consider pulm referral if no better vs PFT's.  Rec'd covid booster. His wife's pcp made similar rec, he will get in [facility].  The patient voiced understanding and agreement to the plan.  [place], DO 10/01/20 2:07 PM

## 2020-10-01 NOTE — Patient Instructions (Addendum)
Give [place] 2-3 business days to get the results of your labs back. If things are normal, I will send in an inhaler.   Keep the diet clean and stay active.   I recommend getting the booster for covid.  Let us know if you need anything.

## 2020-10-02 ENCOUNTER — Other Ambulatory Visit: Payer: Self-pay | Admitting: Family Medicine

## 2020-10-02 LAB — COMPREHENSIVE METABOLIC PANEL
ALT: 21 U/L (ref 0–53)
AST: 12 U/L (ref 0–37)
Albumin: 4.1 g/dL (ref 3.5–5.2)
Alkaline Phosphatase: 63 U/L (ref 39–117)
BUN: 34 mg/dL — ABNORMAL HIGH (ref 6–23)
CO2: 30 mEq/L (ref 19–32)
Calcium: 10.2 mg/dL (ref 8.4–10.5)
Chloride: 96 mEq/L (ref 96–112)
Creatinine, Ser: 1.5 mg/dL (ref 0.40–1.50)
GFR: 44.23 mL/min — ABNORMAL LOW (ref >60.00)
Glucose, Bld: 209 mg/dL — ABNORMAL HIGH (ref 70–99)
Potassium: 5.6 mEq/L — ABNORMAL HIGH (ref 3.5–5.1)
Sodium: 135 mEq/L (ref 135–145)
Total Bilirubin: 0.6 mg/dL (ref 0.2–1.2)
Total Protein: 7 g/dL (ref 6.0–8.3)

## 2020-10-02 LAB — CBC
HCT: 38.1 % — ABNORMAL LOW (ref 39.0–52.0)
Hemoglobin: 12.4 g/dL — ABNORMAL LOW (ref 13.0–17.0)
MCHC: 32.7 g/dL (ref 30.0–36.0)
MCV: 89.8 fl (ref 78.0–100.0)
Platelets: 511 10*3/uL — ABNORMAL HIGH (ref 150.0–400.0)
RBC: 4.24 Mil/uL (ref 4.22–5.81)
RDW: 12.6 % (ref 11.5–15.5)
WBC: 13.2 10*3/uL — ABNORMAL HIGH (ref 4.0–10.5)

## 2020-10-02 LAB — LIPID PANEL
Cholesterol: 135 mg/dL (ref 0–200)
HDL: 44 mg/dL (ref >39.00)
LDL Cholesterol: 68 mg/dL (ref 0–99)
NonHDL: 91.18
Total CHOL/HDL Ratio: 3
Triglycerides: 115 mg/dL (ref 0.0–149.0)
VLDL: 23 mg/dL (ref 0.0–40.0)

## 2020-10-02 LAB — HEPATITIS C ANTIBODY
Hepatitis C Ab: NONREACTIVE
SIGNAL TO CUT-OFF: 0.01 (ref <1.00)

## 2020-10-02 LAB — MAGNESIUM: Magnesium: 1.4 mg/dL — ABNORMAL LOW (ref 1.5–2.5)

## 2020-10-02 LAB — HEMOGLOBIN A1C: Hgb A1c MFr Bld: 8.9 % — ABNORMAL HIGH (ref 4.6–6.5)

## 2020-10-02 MED ORDER — CANAGLIFLOZIN 300 MG PO TABS: 300.0000 mg | ORAL_TABLET | Freq: Every day | ORAL | 2 refills | Status: DC

## 2020-10-03 ENCOUNTER — Other Ambulatory Visit: Payer: Self-pay | Admitting: Family Medicine

## 2020-10-03 ENCOUNTER — Telehealth: Payer: Self-pay | Admitting: Family Medicine

## 2020-10-03 DIAGNOSIS — R0600 Dyspnea, unspecified: Secondary | ICD-10-CM

## 2020-10-03 DIAGNOSIS — R0609 Other forms of dyspnea: Secondary | ICD-10-CM

## 2020-10-03 MED ORDER — PIOGLITAZONE HCL 30 MG PO TABS: 30.0000 mg | ORAL_TABLET | Freq: Every day | ORAL | 2 refills | Status: DC

## 2020-10-03 NOTE — Telephone Encounter (Signed)
Patient states he is not able to afford  canagliflozin . It cost $600.00. Do you have any other suggestion?

## 2020-10-04 LAB — BASIC METABOLIC PANEL
BUN/Creatinine Ratio: 22 (ref 10–24)
BUN: 36 mg/dL — ABNORMAL HIGH (ref 8–27)
CO2: 28 mmol/L (ref 20–29)
Calcium: 9.8 mg/dL (ref 8.6–10.2)
Chloride: 95 mmol/L — ABNORMAL LOW (ref 96–106)
Creatinine, Ser: 1.66 mg/dL — ABNORMAL HIGH (ref 0.76–1.27)
GFR calc Af Amer: 45 mL/min/{1.73_m2} — ABNORMAL LOW (ref >59)
GFR calc non Af Amer: 39 mL/min/{1.73_m2} — ABNORMAL LOW (ref >59)
Glucose: 233 mg/dL — ABNORMAL HIGH (ref 65–99)
Potassium: 5.4 mmol/L — ABNORMAL HIGH (ref 3.5–5.2)
Sodium: 138 mmol/L (ref 134–144)

## 2020-10-04 LAB — CBC WITH DIFFERENTIAL/PLATELET
Basophils Absolute: 0 10*3/uL (ref 0.0–0.2)
Basos: 0 %
EOS (ABSOLUTE): 0 10*3/uL (ref 0.0–0.4)
Eos: 0 %
Hematocrit: 37.3 % — ABNORMAL LOW (ref 37.5–51.0)
Hemoglobin: 12.4 g/dL — ABNORMAL LOW (ref 13.0–17.7)
Immature Grans (Abs): 0.1 10*3/uL (ref 0.0–0.1)
Immature Granulocytes: 1 %
Lymphocytes Absolute: 1.3 10*3/uL (ref 0.7–3.1)
Lymphs: 11 %
MCH: 29.7 pg (ref 26.6–33.0)
MCHC: 33.2 g/dL (ref 31.5–35.7)
MCV: 89 fL (ref 79–97)
Monocytes Absolute: 0.4 10*3/uL (ref 0.1–0.9)
Monocytes: 3 %
Neutrophils Absolute: 10.3 10*3/uL — ABNORMAL HIGH (ref 1.4–7.0)
Neutrophils: 85 %
Platelets: 459 10*3/uL — ABNORMAL HIGH (ref 150–450)
RBC: 4.17 x10E6/uL (ref 4.14–5.80)
RDW: 11.8 % (ref 11.6–15.4)
WBC: 12.2 10*3/uL — ABNORMAL HIGH (ref 3.4–10.8)

## 2020-10-04 LAB — MAGNESIUM: Magnesium: 1.5 mg/dL — ABNORMAL LOW (ref 1.6–2.3)

## 2020-10-06 ENCOUNTER — Other Ambulatory Visit: Payer: Self-pay | Admitting: Family Medicine

## 2020-10-06 MED ORDER — PIOGLITAZONE HCL 30 MG PO TABS: 30.0000 mg | ORAL_TABLET | Freq: Every day | ORAL | 2 refills | Status: DC

## 2020-10-07 ENCOUNTER — Other Ambulatory Visit: Payer: Self-pay | Admitting: Family Medicine

## 2020-10-07 DIAGNOSIS — R0609 Other forms of dyspnea: Secondary | ICD-10-CM

## 2020-10-07 DIAGNOSIS — E612 Magnesium deficiency: Secondary | ICD-10-CM

## 2020-10-07 DIAGNOSIS — R0600 Dyspnea, unspecified: Secondary | ICD-10-CM

## 2020-10-07 LAB — PATHOLOGIST SMEAR REVIEW
Basophils Absolute: 0 10*3/uL (ref 0.0–0.2)
Basos: 0 %
EOS (ABSOLUTE): 0 10*3/uL (ref 0.0–0.4)
Eos: 0 %
Hematocrit: 37.3 % — ABNORMAL LOW (ref 37.5–51.0)
Hemoglobin: 12.3 g/dL — ABNORMAL LOW (ref 13.0–17.7)
Immature Grans (Abs): 0.1 10*3/uL (ref 0.0–0.1)
Immature Granulocytes: 1 %
Lymphocytes Absolute: 1.3 10*3/uL (ref 0.7–3.1)
Lymphs: 11 %
MCH: 29.5 pg (ref 26.6–33.0)
MCHC: 33 g/dL (ref 31.5–35.7)
MCV: 89 fL (ref 79–97)
Monocytes Absolute: 0.5 10*3/uL (ref 0.1–0.9)
Monocytes: 4 %
Neutrophils Absolute: 10.5 10*3/uL — ABNORMAL HIGH (ref 1.4–7.0)
Neutrophils: 84 %
Path Rev PLTs: NORMAL
Platelets: 466 10*3/uL — ABNORMAL HIGH (ref 150–450)
RBC: 4.17 x10E6/uL (ref 4.14–5.80)
RDW: 11.8 % (ref 11.6–15.4)
WBC: 12.4 10*3/uL — ABNORMAL HIGH (ref 3.4–10.8)

## 2020-10-30 LAB — BASIC METABOLIC PANEL
BUN/Creatinine Ratio: 22 (ref 10–24)
BUN: 30 mg/dL — ABNORMAL HIGH (ref 8–27)
CO2: 26 mmol/L (ref 20–29)
Calcium: 9.3 mg/dL (ref 8.6–10.2)
Chloride: 101 mmol/L (ref 96–106)
Creatinine, Ser: 1.36 mg/dL — ABNORMAL HIGH (ref 0.76–1.27)
GFR calc Af Amer: 57 mL/min/{1.73_m2} — ABNORMAL LOW (ref >59)
GFR calc non Af Amer: 49 mL/min/{1.73_m2} — ABNORMAL LOW (ref >59)
Glucose: 88 mg/dL (ref 65–99)
Potassium: 4.1 mmol/L (ref 3.5–5.2)
Sodium: 144 mmol/L (ref 134–144)

## 2020-10-30 LAB — MAGNESIUM: Magnesium: 1.9 mg/dL (ref 1.6–2.3)

## 2020-11-03 ENCOUNTER — Telehealth (INDEPENDENT_AMBULATORY_CARE_PROVIDER_SITE_OTHER): Payer: Medicare Other | Admitting: Family Medicine

## 2020-11-03 ENCOUNTER — Encounter: Payer: Self-pay | Admitting: Family Medicine

## 2020-11-03 ENCOUNTER — Other Ambulatory Visit: Payer: Self-pay

## 2020-11-03 VITALS — BP 120/86

## 2020-11-03 DIAGNOSIS — R0600 Dyspnea, unspecified: Secondary | ICD-10-CM | POA: Diagnosis not present

## 2020-11-03 DIAGNOSIS — I2510 Atherosclerotic heart disease of native coronary artery without angina pectoris: Secondary | ICD-10-CM

## 2020-11-03 MED ORDER — ALBUTEROL SULFATE HFA 108 (90 BASE) MCG/ACT IN AERS
1.0000 | INHALATION_SPRAY | Freq: Four times a day (QID) | RESPIRATORY_TRACT | 1 refills | Status: AC | PRN
Start: 2020-11-03 — End: ?

## 2020-11-03 MED ORDER — PIOGLITAZONE HCL 30 MG PO TABS: 30.0000 mg | ORAL_TABLET | Freq: Every day | ORAL | 2 refills | Status: AC

## 2020-11-03 MED ORDER — LISINOPRIL 10 MG PO TABS: 10.0000 mg | ORAL_TABLET | Freq: Every day | ORAL | 2 refills | Status: AC

## 2020-11-03 NOTE — Progress Notes (Signed)
Chief Complaint  Patient presents with  . Follow-up    BS today was 202     Subjective: Patient is a 80 y.o. male here for DOE. Due to COVID-19 pandemic, we are interacting via web portal for an electronic face-to-face visit. I verified patient's ID using 2 identifiers. Patient agreed to proceed with visit via this method. Patient is at home, I am at office. Patient and I are present for visit.   Repleted magnesium and still breathing hard during physical exertion over past month. He needs to est w cardiology in [facility]. No SOB at rest. No Cp. He has not tried any inhalers. No fevers or other URI s/s's. Of note, he is on chronic daily pred 10 mg/d for PMR.   Past Medical History:  Diagnosis Date  . Anxiety   . Arthritis    hands, lower back  . Benign prostatic hyperplasia with urinary frequency 03/08/2018  . Cataract    surgery to remove - bilateral  . Colon polyps   . Controlled type 2 diabetes mellitus without complication, without long-term current use of insulin ([place]) 01/11/2018  . Coronary artery disease involving coronary bypass graft 02/17/2018  . Diabetes mellitus without complication ([place])   . Heart disease   . Heart murmur    no problems  . History of MI (myocardial infarction)   . Hx of CABG 02/17/2018  . Hyperlipidemia   . Hypertension   . Long term (current) use of systemic steroids 01/11/2018  . Polymyalgia rheumatica ([place]) 01/11/2018  . Seborrheic keratoses 03/08/2018    Objective: BP 120/86 (BP Location: Left Arm, Patient Position: Sitting, Cuff Size: Normal)  No conversational dyspnea Age appropriate judgment and insight Nml affect and mood  Assessment and Plan: Coronary artery disease involving native heart without angina pectoris, unspecified vessel or lesion type - Plan: Ambulatory referral to Cardiology  DOE (dyspnea on exertion) - Plan: albuterol (VENTOLIN HFA) 108 (90 Base) MCG/ACT inhaler   1. Refer to cards in [facility].  2. Trial SABA. If no improvement in 1 mo,  will refer to pulm in [facility]. Hopefully he has seen cards by then.  The patient voiced understanding and agreement to the plan.  [place], DO 11/03/20  2:41 PM

## 2020-11-24 DIAGNOSIS — I10 Essential (primary) hypertension: Secondary | ICD-10-CM

## 2020-11-24 DIAGNOSIS — E1169 Type 2 diabetes mellitus with other specified complication: Secondary | ICD-10-CM

## 2020-11-24 DIAGNOSIS — I252 Old myocardial infarction: Secondary | ICD-10-CM

## 2020-11-24 DIAGNOSIS — R490 Dysphonia: Secondary | ICD-10-CM

## 2020-11-25 MED ORDER — METFORMIN HCL 500 MG PO TABS: 1000.0000 mg | ORAL_TABLET | Freq: Two times a day (BID) | ORAL | 3 refills | Status: AC

## 2020-11-25 MED ORDER — ALENDRONATE SODIUM 70 MG PO TABS: 70.0000 mg | ORAL_TABLET | ORAL | 3 refills | Status: AC

## 2020-11-25 MED ORDER — CARVEDILOL 3.125 MG PO TABS: 3.1250 mg | ORAL_TABLET | Freq: Two times a day (BID) | ORAL | 3 refills | Status: AC

## 2020-11-25 MED ORDER — LEVOCETIRIZINE DIHYDROCHLORIDE 5 MG PO TABS: 5.0000 mg | ORAL_TABLET | Freq: Every evening | ORAL | 2 refills | Status: AC

## 2021-01-08 MED ORDER — ALPRAZOLAM 0.25 MG PO TABS: 0.2500 mg | ORAL_TABLET | Freq: Two times a day (BID) | ORAL | 5 refills | Status: AC | PRN

## 2021-01-08 NOTE — Telephone Encounter (Signed)
Requesting: Xanax Contract: n/a UDS:n/a Last Visit:10/01/2020 Next Visit:n/a Last Refill:03/2020  Please Advise

## 2021-03-24 MED ORDER — ATORVASTATIN CALCIUM 40 MG PO TABS
40.0000 mg | ORAL_TABLET | Freq: Every day | ORAL | 3 refills | Status: AC
Start: 2021-03-24 — End: ?

## 2021-06-15 ENCOUNTER — Ambulatory Visit: Payer: Medicare Other | Admitting: Family Medicine

## 2021-08-12 ENCOUNTER — Telehealth: Payer: Self-pay | Admitting: Family Medicine

## 2021-08-12 NOTE — Telephone Encounter (Signed)
Pt's spouse called wanting to inform that pt passed away 2 days ago, spouse Suhaan Perleberg) is at the hospital - she has been in the hospital for 5 days, any question can contact spouse at [facility] [phone].

## 2021-08-20 DEATH — deceased

## 2021-11-25 ENCOUNTER — Encounter: Payer: Self-pay | Admitting: Internal Medicine

## 2022-01-28 ENCOUNTER — Telehealth: Payer: Self-pay | Admitting: Family Medicine

## 2022-01-28 NOTE — Telephone Encounter (Signed)
Patient's wife, Charlett Nose, called.  She said Geni Bers called her about scheduling AWV for patient.  Charlett Nose said patient passed away last 21-Aug-2023 and she now lives in [place]. ?

## 2022-03-01 NOTE — Telephone Encounter (Signed)
Patient's chart has now been marked deceased based on information received from Pt's Spouse via Care Guide.
